# Patient Record
Sex: Female | Born: 1985 | Race: Black or African American | Hispanic: No | Marital: Single | Smoking: Current some day smoker
Health system: Southern US, Community
[De-identification: ages and names within clinical notes are randomized; demographics above are authoritative.]

## PROBLEM LIST (undated history)

## (undated) DIAGNOSIS — F32A Depression, unspecified: Secondary | ICD-10-CM

## (undated) DIAGNOSIS — F419 Anxiety disorder, unspecified: Secondary | ICD-10-CM

## (undated) DIAGNOSIS — R87612 Low grade squamous intraepithelial lesion on cytologic smear of cervix (LGSIL): Secondary | ICD-10-CM

## (undated) HISTORY — DX: Low grade squamous intraepithelial lesion on cytologic smear of cervix (LGSIL): R87.612

## (undated) HISTORY — DX: Anxiety disorder, unspecified: F41.9

## (undated) HISTORY — DX: Depression, unspecified: F32.A

---

## 2005-06-16 ENCOUNTER — Emergency Department: Payer: Self-pay | Admitting: Unknown Physician Specialty

## 2005-08-28 ENCOUNTER — Emergency Department: Payer: Self-pay | Admitting: Emergency Medicine

## 2007-06-01 ENCOUNTER — Emergency Department: Payer: Self-pay | Admitting: Emergency Medicine

## 2007-06-01 ENCOUNTER — Other Ambulatory Visit: Payer: Self-pay

## 2008-03-15 HISTORY — PX: COLPOSCOPY: SHX161

## 2008-10-03 ENCOUNTER — Emergency Department: Payer: Self-pay | Admitting: Emergency Medicine

## 2008-11-09 ENCOUNTER — Emergency Department: Payer: Self-pay | Admitting: Unknown Physician Specialty

## 2008-11-09 ENCOUNTER — Emergency Department: Payer: Self-pay | Admitting: Emergency Medicine

## 2009-05-17 ENCOUNTER — Emergency Department: Payer: Self-pay | Admitting: Emergency Medicine

## 2009-06-17 ENCOUNTER — Ambulatory Visit: Payer: Self-pay

## 2009-07-10 ENCOUNTER — Emergency Department: Payer: Self-pay | Admitting: Emergency Medicine

## 2009-10-02 ENCOUNTER — Emergency Department: Payer: Self-pay | Admitting: Emergency Medicine

## 2013-07-11 ENCOUNTER — Other Ambulatory Visit (HOSPITAL_COMMUNITY): Payer: Self-pay

## 2013-07-17 ENCOUNTER — Ambulatory Visit (HOSPITAL_COMMUNITY): Payer: Self-pay

## 2013-07-19 ENCOUNTER — Encounter: Payer: Self-pay | Admitting: Medical

## 2013-08-29 ENCOUNTER — Encounter (HOSPITAL_COMMUNITY): Payer: Self-pay | Admitting: Emergency Medicine

## 2013-08-29 ENCOUNTER — Emergency Department (HOSPITAL_COMMUNITY): Payer: Self-pay

## 2013-08-29 ENCOUNTER — Emergency Department (HOSPITAL_COMMUNITY)
Admission: EM | Admit: 2013-08-29 | Discharge: 2013-08-29 | Disposition: A | Discharge status: Home / Self Care | Payer: Self-pay | Attending: Emergency Medicine | Admitting: Emergency Medicine

## 2013-08-29 DIAGNOSIS — M25561 Pain in right knee: Secondary | ICD-10-CM

## 2013-08-29 DIAGNOSIS — Y9341 Activity, dancing: Secondary | ICD-10-CM | POA: Insufficient documentation

## 2013-08-29 DIAGNOSIS — Y929 Unspecified place or not applicable: Secondary | ICD-10-CM | POA: Insufficient documentation

## 2013-08-29 DIAGNOSIS — S8390XA Sprain of unspecified site of unspecified knee, initial encounter: Secondary | ICD-10-CM

## 2013-08-29 DIAGNOSIS — X500XXA Overexertion from strenuous movement or load, initial encounter: Secondary | ICD-10-CM | POA: Insufficient documentation

## 2013-08-29 DIAGNOSIS — S838X9A Sprain of other specified parts of unspecified knee, initial encounter: Secondary | ICD-10-CM | POA: Insufficient documentation

## 2013-08-29 DIAGNOSIS — S86819A Strain of other muscle(s) and tendon(s) at lower leg level, unspecified leg, initial encounter: Principal | ICD-10-CM

## 2013-08-29 MED ORDER — IBUPROFEN 600 MG PO TABS: 600.0000 mg | ORAL_TABLET | Freq: Four times a day (QID) | ORAL | Status: DC | PRN

## 2013-08-29 NOTE — ED Notes (Signed)
C/O right knee pain since 2014. Has had off and on pain in right knee. Pt is applying for a job and wants to be able to tell her potential employer that her knee will not be a problem. No swelling or deformity. States initially injured her knee while dancing.

## 2013-08-29 NOTE — ED Provider Notes (Signed)
CSN: [id number]     Arrival date & time 08/29/13  1152 History  This chart was scribed for non-physician practitioner, Junius FinnerErin O'Malley, PA-C working with Shanna CiscoMegan E Docherty, MD by Greggory StallionKayla Andersen, ED scribe. This patient was seen in room TR05C/TR05C and the patient's care was started at 12:28 PM.   Chief Complaint  Patient presents with  . Knee Pain   The history is provided by the patient. No language interpreter was used.   HPI Comments: Susan Mcknight is a 28 y.o. female who presents to the Emergency Department complaining of right knee injury that occurred one week ago. Pt states her knee popped out of place while dancing. She has sudden onset aching pain with associated mild swelling. Bearing weight worsens the pain, 5/10 at worst. Pt has taken ibuprofen and used an ACE wrap with little relief. Denies numbness or tingling. Denies prior knee surgeries.   History reviewed. No pertinent past medical history. History reviewed. No pertinent past surgical history. No family history on file. History  Substance Use Topics  . Smoking status: Never Smoker   . Smokeless tobacco: Not on file  . Alcohol Use: Yes   OB History   Grav Para Term Preterm Abortions TAB SAB Ect Mult Living                 Review of Systems  Musculoskeletal: Positive for arthralgias and joint swelling.  Neurological: Negative for numbness.  All other systems reviewed and are negative.  Allergies  Review of patient's allergies indicates no known allergies.  Home Medications   Prior to Admission medications   Medication Sig Start Date End Date Taking? Authorizing Provider  ibuprofen (ADVIL,MOTRIN) 600 MG tablet Take 1 tablet (600 mg total) by mouth every 6 (six) hours as needed. 08/29/13   Junius FinnerErin O'Malley, PA-C   BP 113/65  Pulse 77  Temp(Src) 98.7 F (37.1 C) (Oral)  Resp 14  SpO2 100%  LMP 08/13/2013  Physical Exam  Nursing note and vitals reviewed. Constitutional: She is oriented to person, place, and time.  She appears well-developed and well-nourished.  HENT:  Head: Normocephalic and atraumatic.  Eyes: EOM are normal.  Neck: Normal range of motion.  Cardiovascular: Normal rate.   Pedal pulse 2+.   Pulmonary/Chest: Effort normal.  Musculoskeletal: Normal range of motion.  Mild edema to medial aspect of right knee with tenderness along the medial joint space. Full ROM. Strength 5/5 in lower extremities bilaterally.   Neurological: She is alert and oriented to person, place, and time.  Sensation intact.   Skin: Skin is warm and dry. No rash noted. No erythema.  Skin in tact. No ecchymosis, erythema, or warmth. No red streaking, induration, or evidence of underlying infection  Psychiatric: She has a normal mood and affect. Her behavior is normal.    ED Course  Procedures (including critical care time)  DIAGNOSTIC STUDIES: Oxygen Saturation is 100% on RA, normal by my interpretation.    COORDINATION OF CARE: 12:30 PM-Discussed treatment plan which includes xray with pt at bedside and pt agreed to plan.   Labs Review Labs Reviewed - No data to display  Imaging Review Dg Knee Complete 4 Views Right  08/29/2013   CLINICAL DATA:  Pain.  EXAM: RIGHT KNEE - COMPLETE 4+ VIEW  COMPARISON:  None.  FINDINGS: There is no evidence of fracture, dislocation, or joint effusion. There is no evidence of arthropathy or other focal bone abnormality. Soft tissues are unremarkable.  IMPRESSION: Negative.   Electronically Signed  ByMaisie Fus: Thomas  Register   On: 08/29/2013 12:46     EKG Interpretation None      MDM   Final diagnoses:  Right knee pain  Knee sprain    Pt is a 27yo female c/o right knee pain after injuring it while dancing last week. FROM of right knee but mild edema and tenderness along medical joint space line. Neurovascularly in tact. Plain films: negative for acute injury.  Will tx pain symptomatically with knee sleeve and crutches.  Discussed RICE tx at home. Advised to f/u with Dr.  Ranell PatrickNorris, orthopedics if pain not improving. Pt verbalized understanding and agreement with tx plan.  I personally performed the services described in this documentation, which was scribed in my presence. The recorded information has been reviewed and is accurate.  Junius FinnerErin O'Malley, PA-C 08/30/13 [phone]

## 2013-08-31 NOTE — ED Provider Notes (Signed)
Medical screening examination/treatment/procedure(s) were performed by non-physician practitioner and as supervising physician I was immediately available for consultation/collaboration.   Megan E Docherty, MD 08/31/13 0018 

## 2013-10-31 ENCOUNTER — Emergency Department (HOSPITAL_COMMUNITY): Payer: Self-pay

## 2013-10-31 ENCOUNTER — Emergency Department (HOSPITAL_COMMUNITY)
Admission: EM | Admit: 2013-10-31 | Discharge: 2013-10-31 | Disposition: A | Discharge status: Home / Self Care | Payer: Self-pay | Attending: Emergency Medicine | Admitting: Emergency Medicine

## 2013-10-31 ENCOUNTER — Encounter (HOSPITAL_COMMUNITY): Payer: Self-pay | Admitting: Emergency Medicine

## 2013-10-31 DIAGNOSIS — IMO0002 Reserved for concepts with insufficient information to code with codable children: Secondary | ICD-10-CM | POA: Insufficient documentation

## 2013-10-31 DIAGNOSIS — Y9301 Activity, walking, marching and hiking: Secondary | ICD-10-CM | POA: Insufficient documentation

## 2013-10-31 DIAGNOSIS — X500XXA Overexertion from strenuous movement or load, initial encounter: Secondary | ICD-10-CM | POA: Insufficient documentation

## 2013-10-31 DIAGNOSIS — S99919A Unspecified injury of unspecified ankle, initial encounter: Secondary | ICD-10-CM

## 2013-10-31 DIAGNOSIS — S8391XA Sprain of unspecified site of right knee, initial encounter: Secondary | ICD-10-CM

## 2013-10-31 DIAGNOSIS — Y9289 Other specified places as the place of occurrence of the external cause: Secondary | ICD-10-CM | POA: Insufficient documentation

## 2013-10-31 DIAGNOSIS — S99929A Unspecified injury of unspecified foot, initial encounter: Secondary | ICD-10-CM

## 2013-10-31 DIAGNOSIS — S8990XA Unspecified injury of unspecified lower leg, initial encounter: Secondary | ICD-10-CM | POA: Insufficient documentation

## 2013-10-31 MED ORDER — IBUPROFEN 400 MG PO TABS
800.0000 mg | ORAL_TABLET | Freq: Once | ORAL | Status: AC
  Administered 2013-10-31: 800 mg via ORAL
  Filled 2013-10-31: qty 2

## 2013-10-31 NOTE — ED Provider Notes (Signed)
CSN: [id number]     Arrival date & time 10/31/13  1349 History  This chart was scribed for Susan EmeryNicole Dushawn Pusey, NP, working with Susan RudeNathan R. Rubin PayorPickering, MD by Chestine SporeSoijett Mcknight, ED Scribe. The patient was seen in room TR05C/TR05C at 3:18 PM.    Chief Complaint  Patient presents with  . Knee Pain      The history is provided by the patient. No language interpreter was used.   HPI Comments: Susan Mcknight is a 28 y.o. female who presents to the Emergency Department complaining of right knee pain onset 2 days. She states that she twisted her knee while hiking. She rates her pain as 9/10. She states that she thinks she stepped wrong. She states that she has been limping and not putting much weight on it. She states that she has tried OTC aspirin with no relief for her symptoms. She denies any other associated symptoms. She states that she did not take any pain medications.   History reviewed. No pertinent past medical history. History reviewed. No pertinent past surgical history. No family history on file. History  Substance Use Topics  . Smoking status: Never Smoker   . Smokeless tobacco: Not on file  . Alcohol Use: No   OB History   Grav Para Term Preterm Abortions TAB SAB Ect Mult Living                 Review of Systems  A complete 10 system review of systems was obtained and all systems are negative except as noted in the HPI and PMH.    Allergies  Review of patient's allergies indicates no known allergies.  Home Medications   Prior to Admission medications   Not on File   BP 115/63  Pulse 81  Temp(Src) 98.3 F (36.8 C) (Oral)  Resp 18  SpO2 99%  LMP 10/08/2013  Physical Exam  Nursing note and vitals reviewed. Constitutional: She is oriented to person, place, and time. She appears well-developed and well-nourished. No distress.  HENT:  Head: Normocephalic.  Eyes: Conjunctivae and EOM are normal.  Cardiovascular: Normal rate.   Pulmonary/Chest: Effort normal. No stridor.   Musculoskeletal: Normal range of motion.  Right knee:  No deformity, erythema or abrasions. FROM. No effusion or crepitance. Anterior and posterior drawer show no abnormal laxity. Stable to valgus and varus stress. Joint lines are non-tender. Neurovascularly intact. Pt ambulates with non-antalgic gait.    Neurological: She is alert and oriented to person, place, and time.  Psychiatric: She has a normal mood and affect.    ED Course  Procedures (including critical care time) DIAGNOSTIC STUDIES: Oxygen Saturation is 99% on room air, normal by my interpretation.    COORDINATION OF CARE: 3:21 PM-Discussed treatment plan which includes Crutches, Ace Wrap, and Motrin with pt at bedside and pt agreed to plan.   Labs Review Labs Reviewed - No data to display  Imaging Review Dg Knee Complete 4 Views Right  10/31/2013   CLINICAL DATA:  Right knee twisting injury with pain.  EXAM: RIGHT KNEE - COMPLETE 4+ VIEW  COMPARISON:  None.  FINDINGS: There may be a tiny joint effusion. No fracture. No degenerative changes.  IMPRESSION: Question a tiny joint effusion.  No fracture.   Electronically Signed   By: Leanna BattlesMelinda  Blietz M.D.   On: 10/31/2013 14:25     EKG Interpretation None      MDM   Final diagnoses:  Right knee sprain, initial encounter    Filed Vitals:  10/31/13 1400  BP: 115/63  Pulse: 81  Temp: 98.3 F (36.8 C)  TempSrc: Oral  Resp: 18  SpO2: 99%    Medications  ibuprofen (ADVIL,MOTRIN) tablet 800 mg (800 mg Oral Given 10/31/13 1541)    Susan Mcknight is a 28 y.o. female presenting with right knee pain after patient may have twisted it while hiking several days ago. Physical exam and x-ray is unremarkable. Patient will be given crutches, Ace wrap and advised RICE.   Evaluation does not show pathology that would require ongoing emergent intervention or inpatient treatment. Pt is hemodynamically stable and mentating appropriately. Discussed findings and plan with  patient/guardian, who agrees with care plan. All questions answered. Return precautions discussed and outpatient follow up given.    I personally performed the services described in this documentation, which was scribed in my presence. The recorded information has been reviewed and is accurate.    Susan Emery, PA-C 10/31/13 1652

## 2013-10-31 NOTE — ED Notes (Signed)
PT refused crutches. PA aware. OK for discharge

## 2013-10-31 NOTE — Discharge Instructions (Signed)
For pain control you may take up to 800mg  of Motrin (also known as ibuprofen). That is usually 4 over the counter pills,  3 times a day. Take with food to minimize stomach irritation   You can also take  tylenol (acetaminophen) 975mg  (this is 3 over the counter pills) four times a day. Do not drink alcohol or combine with other medications that have acetaminophen as an ingredient (Read the labels!).   Do not hesitate to return to the emergency room for any new, worsening or concerning symptoms.  Please obtain primary care using resource guide below. But the minute you were seen in the emergency room and that they will need to obtain records for further outpatient management.   Emergency Department Resource Guide 1) Find a Doctor and Pay Out of Pocket Although you won't have to find out who is covered by your insurance plan, it is a good idea to ask around and get recommendations. You will then need to call the office and see if the doctor you have chosen will accept you as a new patient and what types of options they offer for patients who are self-pay. Some doctors offer discounts or will set up payment plans for their patients who do not have insurance, but you will need to ask so you aren't surprised when you get to your appointment.  2) Contact Your Local Health Department Not all health departments have doctors that can see patients for sick visits, but many do, so it is worth a call to see if yours does. If you don't know where your local health department is, you can check in your phone book. The CDC also has a tool to help you locate your state's health department, and many state websites also have listings of all of their local health departments.  3) Find a Walk-in Clinic If your illness is not likely to be very severe or complicated, you may want to try a walk in clinic. These are popping up all over the country in pharmacies, drugstores, and shopping centers. They're usually staffed by  nurse practitioners or physician assistants that have been trained to treat common illnesses and complaints. They're usually fairly quick and inexpensive. However, if you have serious medical issues or chronic medical problems, these are probably not your best option.  No Primary Care Doctor: - Call Health Connect at  [phone] - they can help you locate a primary care doctor that  accepts your insurance, provides certain services, etc. - Physician Referral Service- [phone]  Chronic Pain Problems: Organization         Address  Phone   Notes  [facility]  [phone] Patients need to be referred by their primary care doctor.   Medication Assistance: Organization         Address  Phone   Notes  [facility] [address] [place], [place] [id number] [phone] --Must be a resident of [place] -- Must have NO insurance coverage whatsoever (no Medicaid/ Medicare, etc.) -- The pt. MUST have a primary care doctor that directs their care regularly and follows them in the community   MedAssist  [phone]   [employer]  [phone]    Agencies that provide inexpensive medical care: Organization         Address  Phone   Notes  [facility]  [phone]   [facility]    [phone]  [facility] [place], [place] [address] [phone]   [place]. [address] [place] [phone]   Planned Parenthood    [phone]   [facility]    [phone]   [place] and [employer] [address], [place] Phone:  [phone], Fax:  [phone] Hours of Operation:  9 am - 6 pm, M-F.  Also accepts Medicaid/Medicare and self-pay.  [facility] for [address], [address], [place] Phone: [phone], Fax: [phone]. Hours of Operation:  8:30  am - 5:30 pm, M-F.  Also accepts Medicaid and self-pay.  [facility] [address] [place] Phone: [phone]   [place], [place], [place] [phone], Ext. [phone] Mondays & Thursdays: 7-9 AM.  First 15 patients are seen on a first come, first serve basis.    [place] Providers:  Organization         Address  Phone   Notes  [facility] [address], [place] [phone] Also accepts self-pay patients.  [facility] [id number] [place], [place]  [phone]   [place], [address], [place] [phone]   [facility] [address], [place] [phone]   Lucianne Lei [address], [place]   [phone] Only accepts [place] Access [place] patients after they have their name applied to their card.   Self-Pay (no insurance) in [facility]:  Organization         Address  Phone   Notes  Sickle Cell Patients, [facility] Internal Medicine [place] [phone]   [facility] Urgent Care [place] [phone]   [facility] [place]  [place], [place], [place] [phone]   [facility]/Dr. Osei-Bonsu  [address], [place] or [address], [place] [phone] Phone number for both [place] and [place] locations is the same.  Urgent Medical and [facility] [address], [place] [phone]   [facility] [address], [place] or [address] [phone]   [facility] [address] [place] [phone], phone; [phone], fax Sees patients 1st and 3rd Saturday of every month.  Must not qualify for public or private insurance (i.e. Medicaid, Medicare, [place] Health  Choice, Veterans' Benefits)  Household income should be no more than 200% of the poverty level The clinic cannot treat you if you are pregnant or think you are pregnant  Sexually transmitted diseases are not treated at the clinic.    Dental Care: Organization         Address  Phone  Notes  [facility] Department of [facility] [phone] Accepts children up to age 15 who are enrolled in [place] or [place]; pregnant women with a Medicaid card; and children who have applied for Medicaid or [place] Health Choice, but were declined, whose parents can pay a reduced fee at time of service.  [facility] Department of [facility]  [address], [place] [phone] Accepts children up to age 54 who  are enrolled in Medicaid or [place] Health Choice; pregnant women with a Medicaid card; and children who have applied for Medicaid or [place] Health Choice, but were declined, whose parents can pay a reduced fee at time of service.  [place] Adult Dental Access PROGRAM  [place] [phone] Patients are seen by appointment only. Walk-ins are not accepted. [place] will see patients 27 years of age and older. Monday - Tuesday (8am-5pm) Most Wednesdays (8:30-5pm) $30 per visit, cash only  [facility] Adult [facility] Access PROGRAM  [address], [facility] [phone] Patients are seen by appointment only. Walk-ins are not accepted. [place] will see patients 61 years of age and older. One Wednesday Evening (Monthly: Volunteer Based).  $30 per visit, cash only  [place]  [phone] for adults; Children under age 84, call Graduate Pediatric Dentistry at [phone]. Children aged 39-14, please call [phone] to request a pediatric application.  Dental services are provided in all areas of dental care including fillings, crowns and bridges, complete  and partial dentures, implants, gum treatment, root canals, and extractions. Preventive care is also provided. Treatment is provided to both adults and children. Patients are selected via a lottery and there is often a waiting list.   [facility] [address], [place]  [phone] [email or web address]   [facility] [address] [place], [place] [phone], Ext. [phone] Second and Fourth Thursday of each month, opens at 6:30 AM; Clinic ends at 9 AM.  Patients are seen on a first-come first-served basis, and a limited number are seen during each clinic.   [facility]  [address] [place], [place] [phone]   Eligibility Requirements You must have lived in [place], [place], or [place] counties for at least the last three months.   You cannot be eligible for state or federal sponsored [employer], including [employer], [place], or [employer].   You generally cannot be eligible for healthcare insurance through your employer.    How to apply: Eligibility screenings are held every Tuesday and Wednesday afternoon from 1:00 pm until 4:00 pm. You do not need an appointment for the interview!  [facility] [address], [place], [place]   [place]  [place] Department  [place]  [phone]    Behavioral Health Resources in the Community: Intensive Outpatient Programs Organization         Address  Phone  Notes  [place]. [address] [place], [place] [phone]   [facility] [address] [place], [place]   ADS: Alcohol & Drug Svcs [address], [place], [place]   [place] [address]  [place], [place] or [phone]   Substance Abuse Resources Organization          Address  Phone  Notes  Alcohol and Drug Services  [phone]   [place]  [phone]   The [place]   Chinita Pester  [phone]   Residential & Outpatient Substance Abuse Program  [phone]   Psychological Services Organization         Address  Phone  Notes  [facility]  [place]  [phone]   [place] [address],  [place] [phone] or [phone]    Mobile Crisis Teams Organization         Address  Phone  Notes  [facility]atives, Mobile Crisis Care Unit  [phone]   Assertive Psychotherapeutic Services  [address] [place], [place]   [place] [address], [place] [phone]    Self-Help/Support Groups Organization         Address  Phone             Notes  [place]. of [place] - variety of support groups  [place] Call for more information  Narcotics Anonymous (NA), Caring [facility] [address], [employer] [place]  2 meetings at this location   Special educational needs teacher         Address  Phone  Notes  ASAP Residential Treatment [place],    [employer]  [phone]   [facility]  [address], [place] [address]83, [place], [place]   Ricardo [place], [place] [phone] Admissions: 8am-3pm M-F  Incentives Substance [place] [address],    [place], [place] [phone]   [facility] [address] [place], [place], [place]   [facility] [address],  [place], [place] Choice   Insight Programs - Intensive Outpatient [address], [place], [place]   [facility] ([place] [address].,  [place], [place] [phone] or [phone]   [facility]idential Treatment Services (RTS) [address] [place], [place] Accepts Medicaid  [facility] Rodri­guez [place] [address]  [place] [phone] Substance Abuse/Addiction Treatment   [facility] Organization         Address  Phone  Notes  [facility]  [phone]   Domenic Schwab, PhD [address] Arlis [place], [place]   [phone] or [phone]   [facility], [place] [phone]   [facility] [address], [place], [place] [phone] Insurance/Medicaid/sponsorship through [facility] and Families [address]                                    [place], [place] [phone] [place] [address], [place] [phone]    Dr. Adele Schilder  [phone]   [facility] Dept. 1) [address] [place] 2) [place] 3)  [place] [address], [place] [phone]  [phone]   [place] [phone] or [phone] (After Hours)

## 2013-10-31 NOTE — ED Notes (Signed)
Pt presents to department for evaluation of R knee pain, ongoing x2 days. Pt states she injured knee while hiking, no obvious deformities noted. Pt is alert and oriented x4. 9/10 pain upon arrival.

## 2013-10-31 NOTE — Progress Notes (Signed)
Orthopedic Tech Progress Note Patient Details:  Susan LauthLatia Mcknight 10/13/1985 [id number] Pt has declined the crutches;rn notified Ortho Devices Type of Ortho Device: Ace wrap Ortho Device/Splint Location: rle Ortho Device/Splint Interventions: Application   Vincen Bejar 10/31/2013, 3:30 PM

## 2013-11-01 NOTE — ED Provider Notes (Signed)
Medical screening examination/treatment/procedure(s) were performed by non-physician practitioner and as supervising physician I was immediately available for consultation/collaboration.   EKG Interpretation None       Leila Schuff R. Danice Dippolito, MD 11/01/13 0956 

## 2013-12-13 ENCOUNTER — Encounter (HOSPITAL_COMMUNITY): Payer: Self-pay | Admitting: *Deleted

## 2014-01-22 ENCOUNTER — Encounter (HOSPITAL_COMMUNITY): Payer: Self-pay | Admitting: *Deleted

## 2014-01-22 ENCOUNTER — Emergency Department (HOSPITAL_COMMUNITY)
Admission: EM | Admit: 2014-01-22 | Discharge: 2014-01-22 | Disposition: A | Discharge status: Home / Self Care | Payer: Self-pay | Attending: Emergency Medicine | Admitting: Emergency Medicine

## 2014-01-22 DIAGNOSIS — J40 Bronchitis, not specified as acute or chronic: Secondary | ICD-10-CM

## 2014-01-22 DIAGNOSIS — J209 Acute bronchitis, unspecified: Secondary | ICD-10-CM | POA: Insufficient documentation

## 2014-01-22 MED ORDER — AZITHROMYCIN 250 MG PO TABS: 250.0000 mg | ORAL_TABLET | Freq: Every day | ORAL | Status: DC

## 2014-01-22 MED ORDER — DEXTROMETHORPHAN POLISTIREX 30 MG/5ML PO LQCR
30.0000 mg | ORAL | Status: DC | PRN
Start: 2014-01-22 — End: 2014-02-27

## 2014-01-22 NOTE — ED Provider Notes (Signed)
CSN: [id number]     Arrival date & time 01/22/14  1215 History  This chart was scribed for non-physician practitioner, Emilia BeckKaitlyn Laken Rog, PA-C working with Ward GivensIva L Knapp, MD by Greggory StallionKayla Andersen, ED scribe. This patient was seen in room TR08C/TR08C and the patient's care was started at 1:09 PM.    Chief Complaint  Patient presents with  . Nasal Congestion   The history is provided by the patient. No language interpreter was used.   HPI Comments: Susan Mcknight is a 28 y.o. female who presents to the Emergency Department complaining of nasal congestion and productive cough of yellow/green sputum that started one week ago. States it worsened 3 days ago. She has taken tylenol cold and flu and robitussin DM with little relief. Denies fever.   History reviewed. No pertinent past medical history. History reviewed. No pertinent past surgical history. History reviewed. No pertinent family history. History  Substance Use Topics  . Smoking status: Never Smoker   . Smokeless tobacco: Not on file  . Alcohol Use: No   OB History    No data available     Review of Systems  Constitutional: Negative for fever.  HENT: Positive for congestion.   Respiratory: Positive for cough.   All other systems reviewed and are negative.  Allergies  Review of patient's allergies indicates no known allergies.  Home Medications   Prior to Admission medications   Not on File   BP 116/73 mmHg  Pulse 86  Temp(Src) 98.3 F (36.8 C) (Oral)  Resp 14  SpO2 97%  LMP 01/04/2014   Physical Exam  Constitutional: She is oriented to person, place, and time. She appears well-developed and well-nourished. No distress.  HENT:  Head: Normocephalic and atraumatic.  Mouth/Throat: Oropharynx is clear and moist. No oropharyngeal exudate, posterior oropharyngeal edema or posterior oropharyngeal erythema.  Eyes: Conjunctivae and EOM are normal.  Neck: Neck supple. No tracheal deviation present.  Cardiovascular: Normal rate,  regular rhythm and normal heart sounds.   Pulmonary/Chest: Effort normal and breath sounds normal. No respiratory distress. She has no wheezes. She has no rhonchi. She has no rales.  Musculoskeletal: Normal range of motion.  Neurological: She is alert and oriented to person, place, and time.  Skin: Skin is warm and dry.  Psychiatric: She has a normal mood and affect. Her behavior is normal.  Nursing note and vitals reviewed.   ED Course  Procedures (including critical care time)  DIAGNOSTIC STUDIES: Oxygen Saturation is 97% on RA, normal by my interpretation.    COORDINATION OF CARE: 1:11 PM-Discussed treatment plan which includes cough syrup and z-pack with pt at bedside and pt agreed to plan.   Labs Review Labs Reviewed - No data to display  Imaging Review No results found.   EKG Interpretation None      MDM   Final diagnoses:  Bronchitis    Patient's chest exam unremarkable for acute changes. Vitals stable and patient afebrile. Patient will have delsym and azithromycin for bronchitis. Patient instructed to return with worsening or concerning symptoms.   I personally performed the services described in this documentation, which was scribed in my presence. The recorded information has been reviewed and is accurate.  Emilia BeckKaitlyn Fred Hammes, PA-C 01/22/14 [id number]  Claude Swendsen, PA-C 01/26/14 2214  Ward GivensIva L Knapp, MD 01/28/14 1312

## 2014-01-22 NOTE — Discharge Instructions (Signed)
Take azithromycin as directed until gone. Take delsym as needed for cough. Refer to attached documents for more information.

## 2014-01-22 NOTE — ED Notes (Addendum)
Congested x 2 weeks - worse x 3 days.  [place]. Prescribed robitussin dm mix, which resolved her congestion. When coughing so bad she brings up some emesis but n/v. Yellow/green productive cough.

## 2014-01-23 ENCOUNTER — Encounter (HOSPITAL_COMMUNITY): Payer: Self-pay | Admitting: Emergency Medicine

## 2014-01-24 ENCOUNTER — Encounter (HOSPITAL_COMMUNITY): Payer: Self-pay

## 2014-01-24 ENCOUNTER — Ambulatory Visit (HOSPITAL_COMMUNITY)
Admission: RE | Admit: 2014-01-24 | Discharge: 2014-01-24 | Disposition: A | Discharge status: Home / Self Care | Payer: Self-pay | Source: Ambulatory Visit | Attending: Obstetrics and Gynecology | Admitting: Obstetrics and Gynecology

## 2014-01-24 VITALS — BP 118/76 | Ht 62.0 in | Wt 144.6 lb

## 2014-01-24 DIAGNOSIS — Z1239 Encounter for other screening for malignant neoplasm of breast: Secondary | ICD-10-CM

## 2014-01-24 NOTE — Addendum Note (Signed)
Encounter addended by: Priscille Heidelberghristine P Brannock, RN on: 01/24/2014  2:49 PM<BR>     Documentation filed: Notes Section

## 2014-01-24 NOTE — Patient Instructions (Signed)
Explained to Susan LauthLatia Mcknight that she did not need a Pap smear today due to last Pap smear was 11/29/2013. [facility] to follow-up for abnormal Pap smear. Appointment scheduled for Thursday, January 31, 2014 at 1240. Patient aware of appointment and will be there. Let patient know a screening mammogram is recommended at age 28 unless clinically indicated prior. Susan LauthLatia Saladin verbalized understanding.  Skylar Flynt, Kathaleen Maserhristine Poll, RN 12:08 PM

## 2014-01-24 NOTE — Progress Notes (Addendum)
[facility] to follow-up for abnormal Pap smear 11/29/2013.  Pap Smear:    Pap smear not completed today. [facility] and ASCUS HPV +. Patient has a history of two previous abnormal Pap smears 04/10/2013 that was ASCUS HPV+ and in 2010. Patient has a history of a colposcopy completed in 2010 abnormal Pap smear. Patient stated that all Pap smears have been normal since colposcopy.  [facility] for a colposcopy to follow-up for abnormal Pap smear. Appointment scheduled for Thursday, January 31, 2014 at 1240. Pap smear result is scanned into EPIC under media.  Physical exam: Breasts Breasts symmetrical. No skin abnormalities bilateral breasts. No nipple retraction bilateral breasts. No nipple discharge bilateral breasts. No lymphadenopathy. No lumps palpated bilateral breasts. No complaints of pain or tenderness on exam. Screening mammogram recommended at age 28 unless clinically recommended prior.     Pelvic/Bimanual No Pap smear completed today since last Pap smear was 11/29/2013. Pap smear not indicated per [facility] guidelines.

## 2014-01-31 ENCOUNTER — Other Ambulatory Visit (HOSPITAL_COMMUNITY)
Admission: RE | Admit: 2014-01-31 | Discharge: 2014-01-31 | Disposition: A | Discharge status: Home / Self Care | Payer: Self-pay | Source: Ambulatory Visit | Attending: Family Medicine | Admitting: Family Medicine

## 2014-01-31 ENCOUNTER — Ambulatory Visit (INDEPENDENT_AMBULATORY_CARE_PROVIDER_SITE_OTHER): Payer: Self-pay | Admitting: Family Medicine

## 2014-01-31 ENCOUNTER — Encounter: Payer: Self-pay | Admitting: Family Medicine

## 2014-01-31 VITALS — BP 124/71 | HR 86 | Ht 62.0 in | Wt 143.3 lb

## 2014-01-31 DIAGNOSIS — IMO0002 Reserved for concepts with insufficient information to code with codable children: Secondary | ICD-10-CM

## 2014-01-31 DIAGNOSIS — R87610 Atypical squamous cells of undetermined significance on cytologic smear of cervix (ASC-US): Secondary | ICD-10-CM | POA: Insufficient documentation

## 2014-01-31 DIAGNOSIS — R87810 Cervical high risk human papillomavirus (HPV) DNA test positive: Secondary | ICD-10-CM | POA: Insufficient documentation

## 2014-01-31 DIAGNOSIS — N9489 Other specified conditions associated with female genital organs and menstrual cycle: Secondary | ICD-10-CM

## 2014-01-31 LAB — POCT PREGNANCY, URINE: Preg Test, Ur: NEGATIVE

## 2014-01-31 MED ORDER — IBUPROFEN 200 MG PO TABS: 800.0000 mg | ORAL_TABLET | Freq: Once | ORAL | Status: DC

## 2014-01-31 NOTE — Addendum Note (Signed)
Addended by: Gerome ApleyZEYFANG, Remi Rester L on: 01/31/2014 03:45 PM   Modules accepted: Orders

## 2014-01-31 NOTE — Procedures (Signed)
Patient ID: Susan LauthLatia Piano, female   DOB: 02/24/1986, 28 y.o.   MRN: [id number]  Chief Complaint  Patient presents with  . Colposcopy    HPI Susan Mcknight is a 28 y.o. female here for colposcopy. HPI  Indications: Pap smear in January and again in September 2015 showed: ASCUS with POSITIVE high risk HPV. Previous colposcopy: in 2012 in  reportedly noted "HPV traits" and no biopsies. Prior cervical treatment: no treatment.  No past medical history on file.  Past Surgical History  Procedure Laterality Date  . Colposcopy  2010    No family history on file.  Social History History  Substance Use Topics  . Smoking status: Never Smoker   . Smokeless tobacco: Never Used  . Alcohol Use: No    No Known Allergies  Current Outpatient Prescriptions  Medication Sig Dispense Refill  . azithromycin (ZITHROMAX Z-PAK) 250 MG tablet Take 1 tablet (250 mg total) by mouth daily. 500mg  PO day 1, then 250mg  PO days 205 6 tablet 0  . dextromethorphan (DELSYM) 30 MG/5ML liquid Take 5 mLs (30 mg total) by mouth as needed for cough. 89 mL 0  . ibuprofen (ADVIL,MOTRIN) 600 MG tablet Take 1 tablet (600 mg total) by mouth every 6 (six) hours as needed. 30 tablet 0  . Norgestimate-Ethinyl Estradiol Triphasic (ORTHO TRI-CYCLEN LO) 0.18/0.215/0.25 MG-25 MCG tab Take 1 tablet by mouth daily.     Current Facility-Administered Medications  Medication Dose Route Frequency Provider Last Rate Last Dose  . ibuprofen (ADVIL,MOTRIN) tablet 800 mg  800 mg Oral Once Perry MountKristy Rocio Tiona Ruane, MD        Review of Systems Review of Systems See HPI Blood pressure 124/71, pulse 86, height 5\' 2"  (1.575 m), weight 143 lb 4.8 oz (65 kg), last menstrual period 01/28/2014.  Physical Exam Physical Exam  Constitutional: She appears well-developed and well-nourished. No distress.  HENT:  Mouth/Throat: Mucous membranes are moist. Pharynx is normal.  Eyes: Conjunctivae and EOM are normal.  Neck: No adenopathy.    Cardiovascular: Normal rate and S2 normal.   Pulmonary/Chest: Effort normal.  Abdominal: She exhibits no distension. There is no tenderness.  Genitourinary:    Neurological: She is alert.  Skin: Skin is warm. She is not diaphoretic.    Data Reviewed Previous pap  Assessment    Procedure Details  The risks and benefits of the procedure and Written informed consent obtained.  Speculum placed in vagina and excellent visualization of cervix achieved, cervix swabbed x 3 with acetic acid solution.  Specimens: 2 biopsies at 11 o'clock transformation zone.  ECC also done  Complications: none.  Hemostatic biopsy     Plan    Specimens labelled and sent to Pathology. Will call patient in 2 weeks with results of pathology  Medicated with 800mg  ibuprofen post-biopsy      Jaicee Michelotti ROCIO 01/31/2014, 1:36 PM

## 2014-02-20 ENCOUNTER — Telehealth: Payer: Self-pay

## 2014-02-20 NOTE — Telephone Encounter (Signed)
-----   Message from Perry MountKristy Rocio Acosta, MD sent at 02/19/2014  7:16 PM EST ----- Same deal, yes repeat cotesting 1 year.  Wilkie AyeKristy ----- Message -----    From: Louanna Rawaylor M Derian Pfost, RN    Sent: 02/19/2014   8:34 AM      To: Perry MountKristy Rocio Acosta, MD  Hi Dr. Loreta AveAcosta,   In reviewing results, this patient's colpo LSIL-- should we inform the patient to repeat pap/co testing in 1 year? Please advise.   Thank you,   Ladona Ridgelaylor, RN

## 2014-02-20 NOTE — Telephone Encounter (Signed)
Called patient and informed her of results and recommendations. Informed patient she can have repeat pap smear in [facility] year at [facility] she [facility] seen. Patient verbalized understanding and gratitude. No questions or concerns.

## 2014-02-27 ENCOUNTER — Encounter (HOSPITAL_COMMUNITY): Payer: Self-pay | Admitting: Emergency Medicine

## 2014-02-27 ENCOUNTER — Emergency Department (HOSPITAL_COMMUNITY): Payer: Self-pay

## 2014-02-27 ENCOUNTER — Emergency Department (HOSPITAL_COMMUNITY)
Admission: EM | Admit: 2014-02-27 | Discharge: 2014-02-27 | Disposition: A | Discharge status: Home / Self Care | Payer: Self-pay | Attending: Emergency Medicine | Admitting: Emergency Medicine

## 2014-02-27 DIAGNOSIS — R059 Cough, unspecified: Secondary | ICD-10-CM

## 2014-02-27 DIAGNOSIS — J069 Acute upper respiratory infection, unspecified: Secondary | ICD-10-CM | POA: Insufficient documentation

## 2014-02-27 DIAGNOSIS — Z792 Long term (current) use of antibiotics: Secondary | ICD-10-CM | POA: Insufficient documentation

## 2014-02-27 DIAGNOSIS — R05 Cough: Secondary | ICD-10-CM

## 2014-02-27 MED ORDER — KETOROLAC TROMETHAMINE 60 MG/2ML IM SOLN
60.0000 mg | Freq: Once | INTRAMUSCULAR | Status: AC
  Administered 2014-02-27: 60 mg via INTRAMUSCULAR
  Filled 2014-02-27: qty 2

## 2014-02-27 MED ORDER — BENZONATATE 100 MG PO CAPS: 100.0000 mg | ORAL_CAPSULE | Freq: Three times a day (TID) | ORAL | Status: DC

## 2014-02-27 NOTE — ED Provider Notes (Signed)
CSN: [id number]     Arrival date & time 02/27/14  1444 History   First MD Initiated Contact with Patient 02/27/14 1519     Chief Complaint  Patient presents with  . URI     (Consider location/radiation/quality/duration/timing/severity/associated sxs/prior Treatment) HPI  Myrtice LauthLatia Lipps is a 28 y.o. female without significant past medical history presenting with 4-5 days of sinus congestion as well as rhinorrhea, cough productive of thick yellowish mucus. Patient reports some blood in nasal discharge. Patient denies fevers, chills, chest pain, shortness of breath, nausea, vomiting, abdominal pain. She denies history of smoking, COPD, asthma. Patient has taken some over-the-counter pain medicines with mild relief.   History reviewed. No pertinent past medical history. Past Surgical History  Procedure Laterality Date  . Colposcopy  2010   History reviewed. No pertinent family history. History  Substance Use Topics  . Smoking status: Never Smoker   . Smokeless tobacco: Never Used  . Alcohol Use: No   OB History    Gravida Para Term Preterm AB TAB SAB Ectopic Multiple Living   0 0 0 0 0 0 0 0       Review of Systems  Constitutional: Negative for fever and chills.  HENT: Positive for congestion, rhinorrhea, sinus pressure and sore throat.   Respiratory: Positive for cough. Negative for shortness of breath.   Cardiovascular: Negative for chest pain and palpitations.  Gastrointestinal: Negative for nausea, vomiting and diarrhea.  Musculoskeletal: Negative for back pain and gait problem.  Skin: Negative for rash.  Neurological: Negative for weakness and headaches.      Allergies  Review of patient's allergies indicates no known allergies.  Home Medications   Prior to Admission medications   Medication Sig Start Date End Date Taking? Authorizing Provider  naproxen sodium (ANAPROX) 220 MG tablet Take 220 mg by mouth 2 (two) times daily as needed (for pain).   Yes Historical  Provider, MD  azithromycin (ZITHROMAX Z-PAK) 250 MG tablet Take 1 tablet (250 mg total) by mouth daily. 500mg  PO day 1, then 250mg  PO days 205 01/22/14   Kaitlyn Szekalski, PA-C  benzonatate (TESSALON) 100 MG capsule Take 1 capsule (100 mg total) by mouth every 8 (eight) hours. 02/27/14   Benetta SparVictoria L Wayburn Shaler, PA-C   BP 120/74 mmHg  Pulse 107  Temp(Src) 97.7 F (36.5 C) (Oral)  Resp 18  Ht 5\' 2"  (1.575 m)  Wt 130 lb (58.968 kg)  BMI 23.77 kg/m2  SpO2 99%  LMP 02/27/2014 Physical Exam  Constitutional: She appears well-developed and well-nourished. No distress.  HENT:  Head: Normocephalic and atraumatic.  Nose: Right sinus exhibits no maxillary sinus tenderness and no frontal sinus tenderness. Left sinus exhibits no maxillary sinus tenderness and no frontal sinus tenderness.  Mouth/Throat: Mucous membranes are normal. Posterior oropharyngeal erythema present. No oropharyngeal exudate or posterior oropharyngeal edema.  Eyes: Conjunctivae and EOM are normal. Right eye exhibits no discharge. Left eye exhibits no discharge.  Neck: Normal range of motion. Neck supple.  Cardiovascular: Normal rate, regular rhythm and normal heart sounds.   Pulmonary/Chest: Effort normal and breath sounds normal. No respiratory distress. She has no wheezes. She has no rales.  Abdominal: Soft. Bowel sounds are normal. She exhibits no distension. There is no tenderness.  Lymphadenopathy:    She has cervical adenopathy.  Neurological: She is alert.  Skin: Skin is warm and dry. She is not diaphoretic.  Nursing note and vitals reviewed.   ED Course  Procedures (including critical care time) Labs Review  Labs Reviewed - No data to display  Imaging Review Dg Chest 2 View  02/27/2014   CLINICAL DATA:  28 year old female with history productive cough for the past 3 days. No history of fever.  EXAM: CHEST  2 VIEW  COMPARISON:  No priors.  FINDINGS: Lung volumes are normal. No consolidative airspace disease. No  pleural effusions. No pneumothorax. No pulmonary nodule or mass noted. Pulmonary vasculature and the cardiomediastinal silhouette are within normal limits.  IMPRESSION: No radiographic evidence of acute cardiopulmonary disease.   Electronically Signed   By: Trudie Reedaniel  Entrikin M.D.   On: 02/27/2014 16:17     EKG Interpretation None      MDM   Final diagnoses:  Cough  URI (upper respiratory infection)   Pt CXR negative for acute infiltrate. Patients symptoms are consistent with URI, likely viral etiology. Discussed that antibiotics are not indicated for viral infections. Pt will be discharged with symptomatic treatment as well as Sudafed for sinus congestion. Patient given cough suppressant. Verbalizes understanding and is agreeable with plan. Pt is hemodynamically stable & in NAD prior to dc.  Discussed return precautions with patient. Discussed all results and patient verbalizes understanding and agrees with plan.   Louann SjogrenVictoria L Ritvik Mczeal, PA-C 02/27/14 1753  Enid SkeensJoshua M Zavitz, MD 03/01/14 [phone]

## 2014-02-27 NOTE — Discharge Instructions (Signed)
Return to the emergency room with worsening of symptoms, new symptoms or with symptoms that are concerning , especially fevers, stiff neck, worsening headache, nausea/vomiting, visual changes or slurred speech, chest pain, shortness of breath, cough with thick colored mucous or blood Drink plenty of fluids with electrolytes especially Gatorade. OTC cold medications such as mucinex, nyquil, dayquil are recommended. Chloraseptic for sore throat. Go over-the-counter and asked the pharmacist for Sudafed. Take in the morning because it can make you have problems with sleep.   Cough, Adult  A cough is a reflex that helps clear your throat and airways. It can help heal the body or may be a reaction to an irritated airway. A cough may only last 2 or 3 weeks (acute) or may last more than 8 weeks (chronic).  CAUSES Acute cough:  Viral or bacterial infections. Chronic cough:  Infections.  Allergies.  Asthma.  Post-nasal drip.  Smoking.  Heartburn or acid reflux.  Some medicines.  Chronic lung problems (COPD).  Cancer. SYMPTOMS   Cough.  Fever.  Chest pain.  Increased breathing rate.  High-pitched whistling sound when breathing (wheezing).  Colored mucus that you cough up (sputum). TREATMENT   A bacterial cough may be treated with antibiotic medicine.  A viral cough must run its course and will not respond to antibiotics.  Your caregiver may recommend other treatments if you have a chronic cough. HOME CARE INSTRUCTIONS   Only take over-the-counter or prescription medicines for pain, discomfort, or fever as directed by your caregiver. Use cough suppressants only as directed by your caregiver.  Use a cold steam vaporizer or humidifier in your bedroom or home to help loosen secretions.  Sleep in a semi-upright position if your cough is worse at night.  Rest as needed.  Stop smoking if you smoke. SEEK IMMEDIATE MEDICAL CARE IF:   You have pus in your sputum.  Your  cough starts to worsen.  You cannot control your cough with suppressants and are losing sleep.  You begin coughing up blood.  You have difficulty breathing.  You develop pain which is getting worse or is uncontrolled with medicine.  You have a fever. MAKE SURE YOU:   Understand these instructions.  Will watch your condition.  Will get help right away if you are not doing well or get worse. Document Released: 08/28/2010 Document Revised: 05/24/2011 Document Reviewed: 08/28/2010 [facility] Patient Information 2015 [employer] [place]. This information is not intended to replace advice given to you by your health care provider. Make sure you discuss any questions you have with your health care provider.

## 2014-02-27 NOTE — ED Notes (Signed)
Pt sts URI sx with congestion x 4 days

## 2014-03-01 ENCOUNTER — Encounter: Payer: Self-pay | Admitting: *Deleted

## 2014-05-16 ENCOUNTER — Emergency Department (HOSPITAL_COMMUNITY)
Admission: EM | Admit: 2014-05-16 | Discharge: 2014-05-16 | Disposition: A | Discharge status: Home / Self Care | Payer: Self-pay | Attending: Emergency Medicine | Admitting: Emergency Medicine

## 2014-05-16 ENCOUNTER — Encounter (HOSPITAL_COMMUNITY): Payer: Self-pay | Admitting: Emergency Medicine

## 2014-05-16 DIAGNOSIS — J029 Acute pharyngitis, unspecified: Secondary | ICD-10-CM | POA: Insufficient documentation

## 2014-05-16 DIAGNOSIS — Z792 Long term (current) use of antibiotics: Secondary | ICD-10-CM | POA: Insufficient documentation

## 2014-05-16 DIAGNOSIS — Z720 Tobacco use: Secondary | ICD-10-CM | POA: Insufficient documentation

## 2014-05-16 LAB — RAPID STREP SCREEN (MED CTR MEBANE ONLY): Streptococcus, Group A Screen (Direct): NEGATIVE

## 2014-05-16 NOTE — Discharge Instructions (Signed)

## 2014-05-16 NOTE — ED Provider Notes (Signed)
CSN: [id number]     Arrival date & time 05/16/14  0848 History   First MD Initiated Contact with Patient 05/16/14 [phone]     Chief Complaint  Patient presents with  . Sore Throat     (Consider location/radiation/quality/duration/timing/severity/associated sxs/prior Treatment) HPI Comments: Pt states that she was exposed to strep  Patient is a 29 y.o. female presenting with pharyngitis. The history is provided by the patient. No language interpreter was used.  Sore Throat This is a new problem. The current episode started today. The problem occurs constantly. The problem has been unchanged. Associated symptoms include a sore throat. Pertinent negatives include no fever. Nothing aggravates the symptoms. She has tried nothing for the symptoms.    History reviewed. No pertinent past medical history. Past Surgical History  Procedure Laterality Date  . Colposcopy  2010   No family history on file. History  Substance Use Topics  . Smoking status: Current Some Day Smoker    Types: Cigarettes  . Smokeless tobacco: Never Used  . Alcohol Use: No   OB History    Gravida Para Term Preterm AB TAB SAB Ectopic Multiple Living   0 0 0 0 0 0 0 0       Review of Systems  Constitutional: Negative for fever.  HENT: Positive for sore throat.   All other systems reviewed and are negative.     Allergies  Review of patient's allergies indicates no known allergies.  Home Medications   Prior to Admission medications   Medication Sig Start Date End Date Taking? Authorizing Provider  azithromycin (ZITHROMAX Z-PAK) 250 MG tablet Take 1 tablet (250 mg total) by mouth daily. 500mg  PO day 1, then 250mg  PO days 205 01/22/14   Kaitlyn Szekalski, PA-C  benzonatate (TESSALON) 100 MG capsule Take 1 capsule (100 mg total) by mouth every 8 (eight) hours. 02/27/14   Louann SjogrenVictoria L Creech, PA-C  naproxen sodium (ANAPROX) 220 MG tablet Take 220 mg by mouth 2 (two) times daily as needed (for pain).    Historical  Provider, MD   BP 116/79 mmHg  Pulse 76  Temp(Src) 98.1 F (36.7 C) (Oral)  Resp 18  SpO2 96% Physical Exam  Constitutional: She is oriented to person, place, and time. She appears well-developed and well-nourished.  HENT:  Right Ear: External ear normal.  Left Ear: External ear normal.  Mouth/Throat: Posterior oropharyngeal erythema present.  Cardiovascular: Normal rate and regular rhythm.   Pulmonary/Chest: Effort normal and breath sounds normal.  Musculoskeletal: Normal range of motion.  Neurological: She is alert and oriented to person, place, and time.  Skin: Skin is warm and dry.  Nursing note and vitals reviewed.   ED Course  Procedures (including critical care time) Labs Review Labs Reviewed  RAPID STREP SCREEN  CULTURE, GROUP A STREP    Imaging Review No results found.   EKG Interpretation None      MDM   Final diagnoses:  Pharyngitis    Strep is negative. Pt is okay to follow up as needed she can do symptomatic treatment    Teressa LowerVrinda Izumi Mixon, NP 05/16/14 [id number]  Benny LennertJoseph L Zammit, MD 05/16/14 [phone]

## 2014-05-16 NOTE — ED Notes (Signed)
Patient states saw grandmother on Monday and she had strep.  Patient presents with sore throat at this time.  Denies any other symptoms.

## 2014-05-19 LAB — CULTURE, GROUP A STREP: Strep A Culture: NEGATIVE

## 2014-11-25 DIAGNOSIS — Z8619 Personal history of other infectious and parasitic diseases: Secondary | ICD-10-CM | POA: Insufficient documentation

## 2014-12-11 DIAGNOSIS — F3181 Bipolar II disorder: Secondary | ICD-10-CM | POA: Insufficient documentation

## 2014-12-11 DIAGNOSIS — Z62810 Personal history of physical and sexual abuse in childhood: Secondary | ICD-10-CM | POA: Insufficient documentation

## 2015-02-23 ENCOUNTER — Encounter: Payer: Self-pay | Admitting: Emergency Medicine

## 2015-02-23 ENCOUNTER — Emergency Department
Admission: EM | Admit: 2015-02-23 | Discharge: 2015-02-23 | Disposition: A | Discharge status: Home / Self Care | Payer: Self-pay | Attending: Emergency Medicine | Admitting: Emergency Medicine

## 2015-02-23 DIAGNOSIS — S61411A Laceration without foreign body of right hand, initial encounter: Secondary | ICD-10-CM | POA: Insufficient documentation

## 2015-02-23 DIAGNOSIS — F17210 Nicotine dependence, cigarettes, uncomplicated: Secondary | ICD-10-CM | POA: Insufficient documentation

## 2015-02-23 DIAGNOSIS — Y998 Other external cause status: Secondary | ICD-10-CM | POA: Insufficient documentation

## 2015-02-23 DIAGNOSIS — Y9289 Other specified places as the place of occurrence of the external cause: Secondary | ICD-10-CM | POA: Insufficient documentation

## 2015-02-23 DIAGNOSIS — Y289XXA Contact with unspecified sharp object, undetermined intent, initial encounter: Secondary | ICD-10-CM | POA: Insufficient documentation

## 2015-02-23 DIAGNOSIS — Y93G3 Activity, cooking and baking: Secondary | ICD-10-CM | POA: Insufficient documentation

## 2015-02-23 DIAGNOSIS — Z792 Long term (current) use of antibiotics: Secondary | ICD-10-CM | POA: Insufficient documentation

## 2015-02-23 DIAGNOSIS — Z79899 Other long term (current) drug therapy: Secondary | ICD-10-CM | POA: Insufficient documentation

## 2015-02-23 MED ORDER — LIDOCAINE HCL (PF) 1 % IJ SOLN
5.0000 mL | Freq: Once | INTRAMUSCULAR | Status: AC
  Administered 2015-02-23: 5 mL
  Filled 2015-02-23: qty 5

## 2015-02-23 NOTE — ED Notes (Signed)
Pt cut right hand between thumb and 2nd finger while cooking.  Reports some numbness/tingling in right thumb.  Area is bleeding.

## 2015-02-23 NOTE — ED Notes (Signed)
Clean gauze dressing applied over sutures and wrapped to secure.

## 2015-02-23 NOTE — ED Notes (Signed)
Discussed discharge instructions and follow-up care with patient. No questions or concerns at this time. Pt stable at discharge.  

## 2015-02-23 NOTE — ED Provider Notes (Signed)
[facility] Emergency Department Provider Note ____________________________________________  Time seen: 1858  I have reviewed the triage vital signs and the nursing notes.  HISTORY  Chief Complaint  Extremity Laceration  HPI Susan Mcknight is a 29 y.o. female, left hand dominant reports to the ED for evaluation of injury sustained to the right hand. She describes accidentally stabbed herself in the webspace between the thumb and index finger while cooking. She reports pain and some bleeding to the area. She also notes some local tingling and numbness to the right hand. She denies any other injury at this time, and reports a current tetanus status. She rates her discomfort at a 10/10 in triage.  No past medical history on file.  There are no active problems to display for this patient.   Past Surgical History  Procedure Laterality Date  . Colposcopy  2010    Current Outpatient Rx  Name  Route  Sig  Dispense  Refill  . azithromycin (ZITHROMAX Z-PAK) 250 MG tablet   Oral   Take 1 tablet (250 mg total) by mouth daily. 500mg  PO day 1, then 250mg  PO days 205   6 tablet   0   . benzonatate (TESSALON) 100 MG capsule   Oral   Take 1 capsule (100 mg total) by mouth every 8 (eight) hours.   21 capsule   0   . naproxen sodium (ANAPROX) 220 MG tablet   Oral   Take 220 mg by mouth 2 (two) times daily as needed (for pain).          Allergies Review of patient's allergies indicates no known allergies.  No family history on file.  Social History Social History  Substance Use Topics  . Smoking status: Current Some Day Smoker    Types: Cigarettes  . Smokeless tobacco: Never Used  . Alcohol Use: No   Review of Systems  Constitutional: Negative for fever. Eyes: Negative for visual changes. ENT: Negative for sore throat. Cardiovascular: Negative for chest pain. Respiratory: Negative for shortness of breath. Gastrointestinal: Negative for abdominal  pain, vomiting and diarrhea. Genitourinary: Negative for dysuria. Musculoskeletal: Negative for back pain. Skin: Negative for rash. Right hand laceration as above. Neurological: Negative for headaches, focal weakness or numbness. ____________________________________________  PHYSICAL EXAM:  VITAL SIGNS: ED Triage Vitals  Enc Vitals Group     BP 02/23/15 1839 140/81 mmHg     Pulse Rate 02/23/15 1839 100     Resp 02/23/15 1839 18     Temp 02/23/15 1839 98 F (36.7 C)     Temp Source 02/23/15 1839 Oral     SpO2 02/23/15 1839 96 %     Weight --      Height --      Head Cir --      Peak Flow --      Pain Score 02/23/15 1840 10     Pain Loc --      Pain Edu? --      Excl. in GC? --    Constitutional: Alert and oriented. Well appearing and in no distress. Head: Normocephalic and atraumatic.      Eyes: Conjunctivae are normal. PERRL. Normal extraocular movements      Ears: Canals clear. TMs intact bilaterally.   Nose: No congestion/rhinorrhea.   Mouth/Throat: Mucous membranes are moist.   Neck: Supple. No thyromegaly. Hematological/Lymphatic/Immunological: No cervical lymphadenopathy. Cardiovascular: Normal rate, regular rhythm.  Respiratory: Normal respiratory effort. No wheezes/rales/rhonchi. Gastrointestinal: Soft and nontender. No distention. Musculoskeletal:  Normal composite fist to the right hand laceration noted to the first web space. Nontender with normal range of motion in all extremities.  Neurologic: Normal gross sensation normal intrinsic and opposition testing. Normal gait without ataxia. Normal speech and language. No gross focal neurologic deficits are appreciated. Skin:  Skin is warm, dry and intact. No rash noted. Psychiatric: Mood and affect are normal. Patient exhibits appropriate insight and judgment. ____________________________________________  PROCEDURES  LACERATION REPAIR Performed by: Lissa Hoard Authorized by: Lissa Hoard Consent: Verbal consent obtained. Risks and benefits: risks, benefits and alternatives were discussed Consent given by: patient Patient identity confirmed: provided demographic data Prepped and Draped in normal sterile fashion Wound explored  Laceration Location: right hand  Laceration Length: 4 cm  No Foreign Bodies seen or palpated  Anesthesia: local infiltration  Local anesthetic: lidocaine 1% w/o epinephrine  Anesthetic total: 3 ml  Irrigation method: syringe Amount of cleaning: standard  Skin closure: 4-0 nylon  Number of sutures: 5  Technique: interrupted  Patient tolerance: Patient tolerated the procedure well with no immediate complications.  ____________________________________________  INITIAL IMPRESSION / ASSESSMENT AND PLAN / ED COURSE  Laceration to the right hand status post suture repair. Patient's discharge with wound care instructions and encouraged follow-up with local community clinic, or urgent care center for suture removal in 10-14 days. ____________________________________________  FINAL CLINICAL IMPRESSION(S) / ED DIAGNOSES  Final diagnoses:  Laceration of right hand, initial encounter      Lissa Hoard, PA-C 02/23/15 2010  Emily Filbert, MD 02/23/15 2048

## 2015-02-23 NOTE — Discharge Instructions (Signed)
Laceration Care, Adult  A laceration is a cut that goes through all layers of the skin. The cut also goes into the tissue that is right under the skin. Some cuts heal on their own. Others need to be closed with stitches (sutures), staples, skin adhesive strips, or wound glue. Taking care of your cut lowers your risk of infection and helps your cut to heal better.  HOW TO TAKE CARE OF YOUR CUT  For stitches or staples:  · Keep the wound clean and dry.  · If you were given a bandage (dressing), you should change it at least one time per day or as told by your doctor. You should also change it if it gets wet or dirty.  · Keep the wound completely dry for the first 24 hours or as told by your doctor. After that time, you may take a shower or a bath. However, make sure that the wound is not soaked in water until after the stitches or staples have been removed.  · Clean the wound one time each day or as told by your doctor:    Wash the wound with soap and water.    Rinse the wound with water until all of the soap comes off.    Pat the wound dry with a clean towel. Do not rub the wound.  · After you clean the wound, put a thin layer of antibiotic ointment on it as told by your doctor. This ointment:    Helps to prevent infection.    Keeps the bandage from sticking to the wound.  · Have your stitches or staples removed as told by your doctor.  If your doctor used skin adhesive strips:   · Keep the wound clean and dry.  · If you were given a bandage, you should change it at least one time per day or as told by your doctor. You should also change it if it gets dirty or wet.  · Do not get the skin adhesive strips wet. You can take a shower or a bath, but be careful to keep the wound dry.  · If the wound gets wet, pat it dry with a clean towel. Do not rub the wound.  · Skin adhesive strips fall off on their own. You can trim the strips as the wound heals. Do not remove any strips that are still stuck to the wound. They will  fall off after a while.  If your doctor used wound glue:  · Try to keep your wound dry, but you may briefly wet it in the shower or bath. Do not soak the wound in water, such as by swimming.  · After you take a shower or a bath, gently pat the wound dry with a clean towel. Do not rub the wound.  · Do not do any activities that will make you really sweaty until the skin glue has fallen off on its own.  · Do not apply liquid, cream, or ointment medicine to your wound while the skin glue is still on.  · If you were given a bandage, you should change it at least one time per day or as told by your doctor. You should also change it if it gets dirty or wet.  · If a bandage is placed over the wound, do not let the tape for the bandage touch the skin glue.  · Do not pick at the glue. The skin glue usually stays on for 5-10 days. Then, it   or when wound glue stays in place and the wound is healed. Make sure to wear a sunscreen of at least 30 SPF.  Take over-the-counter and prescription medicines only as told by your doctor.  If you were given antibiotic medicine or ointment, take or apply it as told by your doctor. Do not stop using the antibiotic even if your wound is getting better.  Do not scratch or pick at the wound.  Keep all follow-up visits as told by your doctor. This is important.  Check your wound every day for signs of infection. Watch for:  Redness, swelling, or pain.  Fluid, blood, or pus.  Raise (elevate) the injured area above the level of your heart while you are sitting or lying down, if possible. GET HELP IF:  You got a tetanus shot and you have any of these problems at the injection site:  Swelling.  Very bad pain.  Redness.  Bleeding.  You have a fever.  A wound that was  closed breaks open.  You notice a bad smell coming from your wound or your bandage.  You notice something coming out of the wound, such as wood or glass.  Medicine does not help your pain.  You have more redness, swelling, or pain at the site of your wound.  You have fluid, blood, or pus coming from your wound.  You notice a change in the color of your skin near your wound.  You need to change the bandage often because fluid, blood, or pus is coming from the wound.  You start to have a new rash.  You start to have numbness around the wound. GET HELP RIGHT AWAY IF:  You have very bad swelling around the wound.  Your pain suddenly gets worse and is very bad.  You notice painful lumps near the wound or on skin that is anywhere on your body.  You have a red streak going away from your wound.  The wound is on your hand or foot and you cannot move a finger or toe like you usually can.  The wound is on your hand or foot and you notice that your fingers or toes look pale or bluish.   This information is not intended to replace advice given to you by your health care provider. Make sure you discuss any questions you have with your health care provider.   Document Released: 08/18/2007 Document Revised: 07/16/2014 Document Reviewed: 02/25/2014 Elsevier Interactive Patient Education [employer]2016 [employer]  Keep the wound clean, dry, and covered.  Wash regularly with soap & water. [facility], [facility], or a local urgent care for suture removal in 10-14 days.

## 2015-02-26 ENCOUNTER — Telehealth (HOSPITAL_COMMUNITY): Payer: Self-pay | Admitting: *Deleted

## 2015-02-26 NOTE — Telephone Encounter (Signed)
Attempted to call patient to schedule follow up appointment with [facility]. No one answered phone. Left voicemail for patient to call me back. 

## 2015-05-26 ENCOUNTER — Emergency Department
Admission: EM | Admit: 2015-05-26 | Discharge: 2015-05-26 | Disposition: A | Discharge status: Home / Self Care | Payer: Self-pay | Attending: Emergency Medicine | Admitting: Emergency Medicine

## 2015-05-26 ENCOUNTER — Encounter: Payer: Self-pay | Admitting: Emergency Medicine

## 2015-05-26 DIAGNOSIS — F17210 Nicotine dependence, cigarettes, uncomplicated: Secondary | ICD-10-CM | POA: Insufficient documentation

## 2015-05-26 DIAGNOSIS — J209 Acute bronchitis, unspecified: Secondary | ICD-10-CM

## 2015-05-26 MED ORDER — BENZONATATE 100 MG PO CAPS: 200.0000 mg | ORAL_CAPSULE | Freq: Three times a day (TID) | ORAL | Status: DC | PRN

## 2015-05-26 MED ORDER — AZITHROMYCIN 250 MG PO TABS: ORAL_TABLET | ORAL | Status: DC

## 2015-05-26 NOTE — ED Notes (Signed)
Cold sx's for the several weeks  Cough is occasional prod but dry mostly   No relief with OTC meds

## 2015-05-26 NOTE — ED Provider Notes (Signed)
[facility] Emergency Department Provider Note  ____________________________________________  Time seen: Approximately 8:08 AM  I have reviewed the triage vital signs and the nursing notes.   HISTORY  Chief Complaint Cough    HPI Susan Mcknight is a 30 y.o. female , NAD, presents to the emergency department with cold symptoms and cough for couple of weeks. Notes her cough can be cyclical in nature. Has been taking over-the-counter medications with no relief of symptoms. Denies any fever, chills, body aches. His not had any abdominal pain, nausea, vomiting. Denies overt chest pain or back pain. Does have some muscular pain with cough due to length of time. Denies any shortness of breath, wheezing, dizziness, visual changes. Has had no headaches.   History reviewed. No pertinent past medical history.  There are no active problems to display for this patient.   Past Surgical History  Procedure Laterality Date  . Colposcopy  2010    Current Outpatient Rx  Name  Route  Sig  Dispense  Refill  . azithromycin (ZITHROMAX Z-PAK) 250 MG tablet      Take 2 tablets (500 mg) on  Day 1,  followed by 1 tablet (250 mg) once daily on Days 2 through 5.   6 each   0   . benzonatate (TESSALON PERLES) 100 MG capsule   Oral   Take 2 capsules (200 mg total) by mouth 3 (three) times daily as needed for cough.   30 capsule   0     Allergies Review of patient's allergies indicates no known allergies.  No family history on file.  Social History Social History  Substance Use Topics  . Smoking status: Current Some Day Smoker    Types: Cigarettes  . Smokeless tobacco: Never Used  . Alcohol Use: No     Review of Systems  Constitutional: No fever/chills, sweats Eyes: No visual changes. No discharge ENT: No sore throat, nasal congestion, runny nose, ear pain. Cardiovascular: No chest pain. Respiratory: Positive dry cough with mild chest congestion. No shortness  of breath. No wheezing.  Gastrointestinal: No abdominal pain.  No nausea, vomiting.  No diarrhea.   Musculoskeletal: Negative for back pain. No general myalgias. Skin: Negative for rash. Neurological: Negative for headaches, focal weakness or numbness. 10-point ROS otherwise negative.  ____________________________________________   PHYSICAL EXAM:  VITAL SIGNS: ED Triage Vitals  Enc Vitals Group     BP 05/26/15 0801 121/67 mmHg     Pulse Rate 05/26/15 0801 78     Resp 05/26/15 0801 18     Temp 05/26/15 0801 97.7 F (36.5 C)     Temp Source 05/26/15 0801 Oral     SpO2 05/26/15 0801 100 %     Weight 05/26/15 0801 141 lb (63.957 kg)     Height 05/26/15 0801  (1.575 m)     Head Cir --      Peak Flow --      Pain Score --      Pain Loc --      Pain Edu? --      Excl. in GC? --     Constitutional: Alert and oriented. Well appearing and in no acute distress. Eyes: Conjunctivae are normal. PERRL. EOMI without pain.  Head: Atraumatic. ENT:      Ears: TMs visualized bilaterally without erythema, bulging, perforation, effusion.      Nose: No congestion/rhinnorhea.      Mouth/Throat: Mucous membranes are moist. Pharynx without erythema, exudate, swelling. Mild clear  postnasal drip. Neck: No stridor. Supple with full range of motion. Hematological/Lymphatic/Immunilogical: No cervical lymphadenopathy. Cardiovascular: Normal rate, regular rhythm. Normal S1 and S2.  Good peripheral circulation. Respiratory: Normal respiratory effort without tachypnea or retractions. Lungs CTAB. Neurologic:  Normal speech and language. No gross focal neurologic deficits are appreciated.  Skin:  Skin is warm, dry and intact. No rash noted. Psychiatric: Mood and affect are normal. Speech and behavior are normal. Patient exhibits appropriate insight and judgement.   ____________________________________________    LABS  None  ____________________________________________  EKG  None ____________________________________________  RADIOLOGY  None ____________________________________________    PROCEDURES  Procedure(s) performed: None    Medications - No data to display   ____________________________________________   INITIAL IMPRESSION / ASSESSMENT AND PLAN / ED COURSE  Patient's diagnosis is consistent with acute bronchitis after upper respiratory infection. Patient will be discharged home with prescriptions for azithromycin and Tessalon Perles to take as directed. Patient is to [facility] if symptoms persist past this treatment course. Patient is given ED precautions to return to the ED for any worsening or new symptoms.    ____________________________________________  FINAL CLINICAL IMPRESSION(S) / ED DIAGNOSES  Final diagnoses:  Acute bronchitis, unspecified organism      NEW MEDICATIONS STARTED DURING THIS VISIT:  Discharge Medication List as of 05/26/2015  8:20 AM           Hope PigeonJami L Hagler, PA-C 05/26/15 [id number]  Jene Everyobert Kinner, MD 05/26/15 1213

## 2015-05-26 NOTE — Discharge Instructions (Signed)

## 2015-08-15 IMAGING — DX DG CHEST 2V
2 series · 2 of 2 positions shown · non-contrast
Comparison: No priors.

CLINICAL DATA: 28-year-old female with history productive cough for
the past 3 days. No history of fever.

EXAM:
CHEST  2 VIEW

[chest pa]
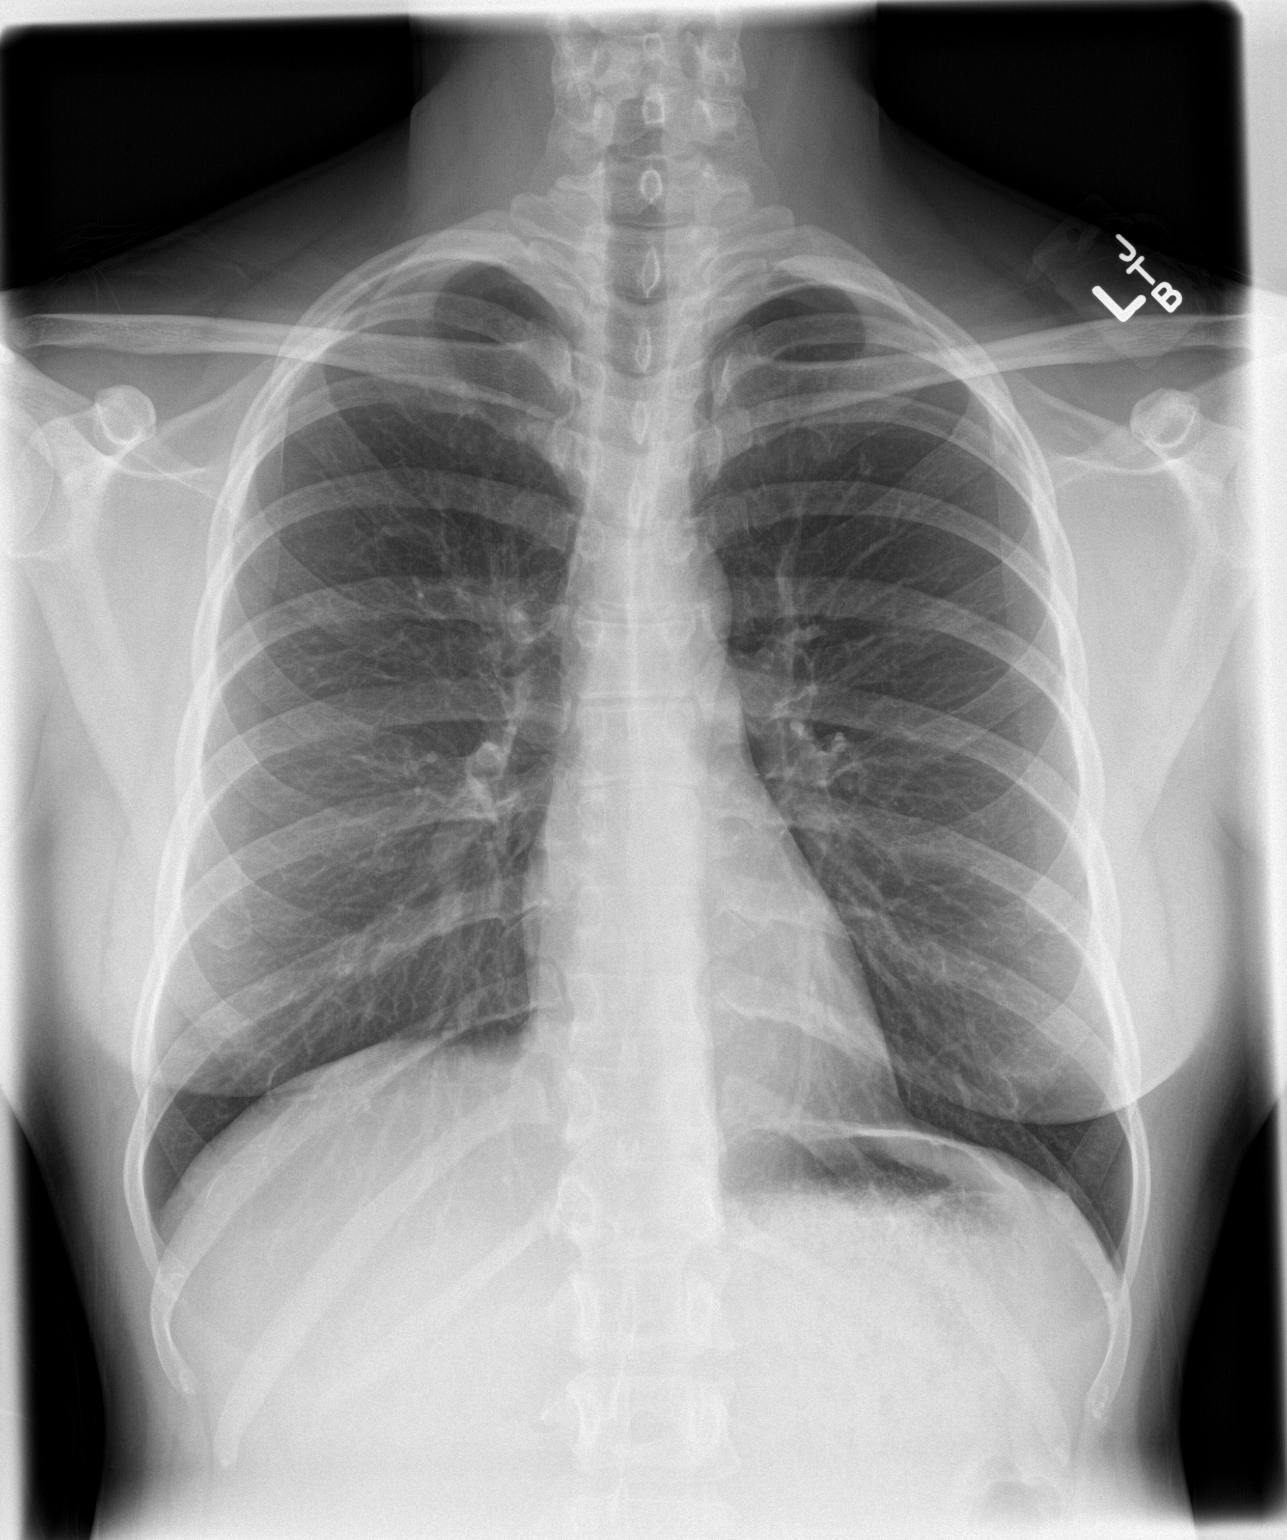

[chest lat]
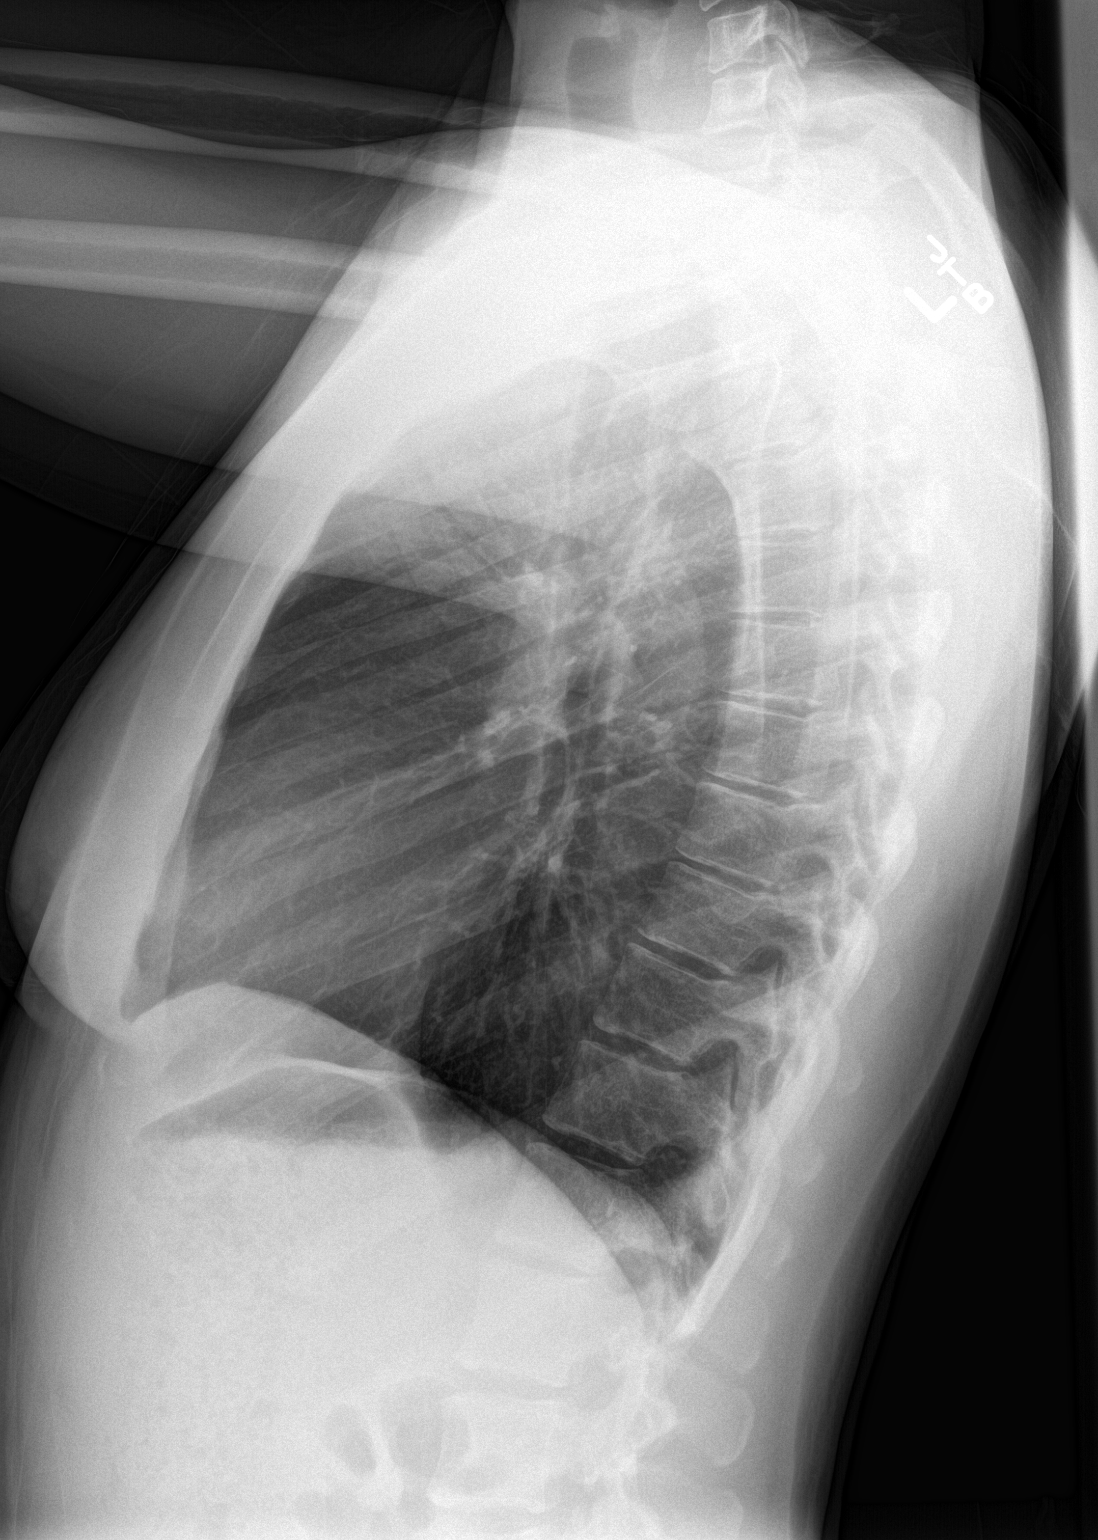

[2 of 2 positions shown; findings below may reference images not displayed]

FINDINGS: Lung volumes are normal. No consolidative airspace disease. No
pleural effusions. No pneumothorax. No pulmonary nodule or mass
noted. Pulmonary vasculature and the cardiomediastinal silhouette
are within normal limits.
IMPRESSION: No radiographic evidence of acute cardiopulmonary disease.

## 2015-09-09 ENCOUNTER — Encounter (HOSPITAL_COMMUNITY): Payer: Self-pay | Admitting: *Deleted

## 2015-09-12 ENCOUNTER — Ambulatory Visit (HOSPITAL_COMMUNITY): Payer: Self-pay

## 2015-09-22 ENCOUNTER — Telehealth (HOSPITAL_COMMUNITY): Payer: Self-pay | Admitting: *Deleted

## 2015-09-22 NOTE — Telephone Encounter (Signed)
Telephoned home # and disconnected number.

## 2015-09-29 ENCOUNTER — Telehealth (HOSPITAL_COMMUNITY): Payer: Self-pay | Admitting: *Deleted

## 2015-09-29 NOTE — Telephone Encounter (Signed)
Telephoned patient's home # and not in service

## 2015-10-03 ENCOUNTER — Encounter: Payer: Self-pay | Admitting: Obstetrics & Gynecology

## 2016-01-01 ENCOUNTER — Ambulatory Visit (HOSPITAL_COMMUNITY): Payer: Self-pay

## 2016-01-09 ENCOUNTER — Encounter: Payer: Self-pay | Admitting: Family Medicine

## 2016-02-19 ENCOUNTER — Encounter (HOSPITAL_COMMUNITY): Payer: Self-pay | Admitting: *Deleted

## 2016-02-19 ENCOUNTER — Emergency Department (HOSPITAL_COMMUNITY)
Admission: EM | Admit: 2016-02-19 | Discharge: 2016-02-20 | Disposition: A | Discharge status: Home / Self Care | Payer: Self-pay | Attending: Emergency Medicine | Admitting: Emergency Medicine

## 2016-02-19 DIAGNOSIS — N390 Urinary tract infection, site not specified: Secondary | ICD-10-CM

## 2016-02-19 DIAGNOSIS — R300 Dysuria: Secondary | ICD-10-CM

## 2016-02-19 DIAGNOSIS — A599 Trichomoniasis, unspecified: Secondary | ICD-10-CM

## 2016-02-19 DIAGNOSIS — B9689 Other specified bacterial agents as the cause of diseases classified elsewhere: Secondary | ICD-10-CM

## 2016-02-19 DIAGNOSIS — A5901 Trichomonal vulvovaginitis: Secondary | ICD-10-CM | POA: Insufficient documentation

## 2016-02-19 DIAGNOSIS — F17210 Nicotine dependence, cigarettes, uncomplicated: Secondary | ICD-10-CM | POA: Insufficient documentation

## 2016-02-19 DIAGNOSIS — N760 Acute vaginitis: Secondary | ICD-10-CM

## 2016-02-19 LAB — WET PREP, GENITAL
Sperm: NONE SEEN
Yeast Wet Prep HPF POC: NONE SEEN

## 2016-02-19 LAB — COMPREHENSIVE METABOLIC PANEL
ALBUMIN: 4.3 g/dL (ref 3.5–5.0)
ALK PHOS: 67 U/L (ref 38–126)
ALT: 17 U/L (ref 14–54)
AST: 23 U/L (ref 15–41)
Anion gap: 10 (ref 5–15)
BUN: 12 mg/dL (ref 6–20)
CHLORIDE: 104 mmol/L (ref 101–111)
CO2: 24 mmol/L (ref 22–32)
CREATININE: 0.93 mg/dL (ref 0.44–1.00)
Calcium: 9.6 mg/dL (ref 8.9–10.3)
GFR calc non Af Amer: >60 mL/min (ref >60)
GLUCOSE: 98 mg/dL (ref 65–99)
Potassium: 3.7 mmol/L (ref 3.5–5.1)
SODIUM: 138 mmol/L (ref 135–145)
Total Bilirubin: 0.7 mg/dL (ref 0.3–1.2)
Total Protein: 7.2 g/dL (ref 6.5–8.1)

## 2016-02-19 LAB — I-STAT BETA HCG BLOOD, ED (MC, WL, AP ONLY): I-stat hCG, quantitative: <5 m[IU]/mL (ref <5)

## 2016-02-19 LAB — CBC
HCT: 40.7 % (ref 36.0–46.0)
Hemoglobin: 13.8 g/dL (ref 12.0–15.0)
MCH: 32 pg (ref 26.0–34.0)
MCHC: 33.9 g/dL (ref 30.0–36.0)
MCV: 94.4 fL (ref 78.0–100.0)
PLATELETS: 370 10*3/uL (ref 150–400)
RBC: 4.31 MIL/uL (ref 3.87–5.11)
RDW: 13.2 % (ref 11.5–15.5)
WBC: 10.1 10*3/uL (ref 4.0–10.5)

## 2016-02-19 LAB — URINALYSIS, ROUTINE W REFLEX MICROSCOPIC
Bacteria, UA: NONE SEEN
Bilirubin Urine: NEGATIVE
Glucose, UA: NEGATIVE mg/dL
Hgb urine dipstick: NEGATIVE
KETONES UR: 5 mg/dL — AB
Nitrite: NEGATIVE
PH: 6 (ref 5.0–8.0)
Protein, ur: NEGATIVE mg/dL
Specific Gravity, Urine: 1.019 (ref 1.005–1.030)

## 2016-02-19 LAB — LIPASE, BLOOD: LIPASE: 17 U/L (ref 11–51)

## 2016-02-19 MED ORDER — FENTANYL CITRATE (PF) 100 MCG/2ML IJ SOLN: 50.0000 ug | Freq: Once | INTRAMUSCULAR | Status: DC

## 2016-02-19 MED ORDER — FENTANYL CITRATE (PF) 100 MCG/2ML IJ SOLN
50.0000 ug | Freq: Once | INTRAMUSCULAR | Status: AC
  Administered 2016-02-19: 50 ug via INTRAMUSCULAR
  Filled 2016-02-19: qty 2

## 2016-02-19 NOTE — ED Triage Notes (Signed)
Per EMS, pt is homeless, reports tingling when urinating with low abd pain.  Denies hematuria.  Sxs onset is Friday.  Rates pain 9/10.

## 2016-02-19 NOTE — ED Provider Notes (Addendum)
[facility]-EMERGENCY DEPT Provider Note   CSN: [id number] Arrival date & time: 02/19/16  1652     History   Chief Complaint Chief Complaint  Patient presents with  . Abdominal Pain  . Dysuria    HPI Susan Mcknight is a 30 y.o. female.   Abdominal Pain   Associated symptoms include dysuria. Pertinent negatives include nausea, vomiting and hematuria.  Dysuria   This is a new problem. Episode onset: 1 week. The problem has been gradually worsening. The quality of the pain is described as burning. The pain is moderate. There has been no fever. Associated symptoms include discharge and urgency. Pertinent negatives include no chills, no sweats, no nausea, no vomiting and no hematuria. Her past medical history does not include recurrent UTIs.   LMP: spotting since Oct. On Depoprovera.  Prior GC/Chlam and Ivery Qualerich; last in June. Has new partner and having unprotected sex.  During history taking, pt is labile and states that she does not wan to wake up anymore. States she has racing thoughts. Reports being told she has Bipolar and not on medications. Admits to prior suicide attempts. No active SI at this time.no HI or AVH. States she feels like she doesn't belong.  History reviewed. No pertinent past medical history.  There are no active problems to display for this patient.   Past Surgical History:  Procedure Laterality Date  . COLPOSCOPY  2010    OB History    Gravida Para Term Preterm AB Living   0 0 0 0 0     SAB TAB Ectopic Multiple Live Births   0 0 0           Home Medications    Prior to Admission medications   Medication Sig Start Date End Date Taking? Authorizing Provider  cephALEXin (KEFLEX) 500 MG capsule Take 1 capsule (500 mg total) by mouth 2 (two) times daily. 02/20/16 02/25/16  Nira ConnPedro Eduardo Rae Plotner, MD  metroNIDAZOLE (FLAGYL) 500 MG tablet Take 1 tablet (500 mg total) by mouth 2 (two) times daily. 02/20/16 02/27/16  Nira ConnPedro Eduardo Kiarah Eckstein, MD    Family  History History reviewed. No pertinent family history.  Social History Social History  Substance Use Topics  . Smoking status: Current Some Day Smoker    Types: Cigarettes  . Smokeless tobacco: Never Used  . Alcohol use No     Allergies   Patient has no known allergies.   Review of Systems Review of Systems  Constitutional: Negative for chills.  Gastrointestinal: Positive for abdominal pain. Negative for nausea and vomiting.  Genitourinary: Positive for dysuria and urgency. Negative for hematuria.  Psychiatric/Behavioral: Positive for agitation and dysphoric mood. Negative for self-injury.  Ten systems are reviewed and are negative for acute change except as noted in the HPI     Physical Exam Updated Vital Signs BP 116/85 (BP Location: Right Arm)   Pulse 94   Temp 98.1 F (36.7 C) (Oral)   Resp 24   SpO2 100%   Physical Exam  Constitutional: She is oriented to person, place, and time. She appears well-developed and well-nourished. No distress.  HENT:  Head: Normocephalic and atraumatic.  Nose: Nose normal.  Eyes: Conjunctivae and EOM are normal. Pupils are equal, round, and reactive to light. Right eye exhibits no discharge. Left eye exhibits no discharge. No scleral icterus.  Neck: Normal range of motion. Neck supple.  Cardiovascular: Normal rate and regular rhythm.  Exam reveals no gallop and no friction rub.   No murmur heard.  Pulmonary/Chest: Effort normal and breath sounds normal. No stridor. No respiratory distress. She has no rales.  Abdominal: Soft. She exhibits no distension. There is tenderness in the suprapubic area. There is no rigidity, no rebound and no guarding.  Genitourinary: Uterus is not tender. Cervix exhibits no motion tenderness, no discharge and no friability. Right adnexum displays no mass, no tenderness and no fullness. Left adnexum displays no mass, no tenderness and no fullness. There is bleeding in the vagina. No tenderness in the vagina. No  vaginal discharge found.  Genitourinary Comments: Chaperone present during pelvic exam.   Musculoskeletal: She exhibits no edema or tenderness.  Neurological: She is alert and oriented to person, place, and time.  Skin: Skin is warm and dry. No rash noted. She is not diaphoretic. No erythema.  Psychiatric: She has a normal mood and affect.  Vitals reviewed.    ED Treatments / Results  Labs (all labs ordered are listed, but only abnormal results are displayed) Labs Reviewed  WET PREP, GENITAL - Abnormal; Notable for the following:       Result Value   Trich, Wet Prep PRESENT (*)    Clue Cells Wet Prep HPF POC PRESENT (*)    WBC, Wet Prep HPF POC FEW (*)    All other components within normal limits  URINALYSIS, ROUTINE W REFLEX MICROSCOPIC - Abnormal; Notable for the following:    APPearance HAZY (*)    Ketones, ur 5 (*)    Leukocytes, UA LARGE (*)    Squamous Epithelial / LPF 6-30 (*)    All other components within normal limits  LIPASE, BLOOD  COMPREHENSIVE METABOLIC PANEL  CBC  I-STAT BETA HCG BLOOD, ED ([facility], [facility], [facility] ONLY)  GC/CHLAMYDIA PROBE AMP ([place]) NOT AT [facility]    EKG  EKG Interpretation None       Radiology No results found.  Procedures Procedures (including critical care time)  Medications Ordered in ED Medications  cephALEXin (KEFLEX) capsule 500 mg (not administered)  ondansetron (ZOFRAN-ODT) disintegrating tablet 4 mg (not administered)  metroNIDAZOLE (FLAGYL) tablet 500 mg (not administered)  fentaNYL (SUBLIMAZE) injection 50 mcg (50 mcg Intramuscular Given 02/19/16 2356)     Initial Impression / Assessment and Plan / ED Course  I have reviewed the triage vital signs and the nursing notes.  Pertinent labs & imaging results that were available during my care of the patient were reviewed by me and considered in my medical decision making (see chart for details).  Clinical Course     Workup consistent with urinary tract infection as well as  Trichomonas. Exam without evidence of cervicitis or PID. GC/chlamydia swabs sent.  Behavioral health was consulted who evaluated the patient in the emergency department and felt that she did not meet inpatient criteria. They provided the patient with outpatient resources.  The patient is safe for discharge with strict return precautions.   Final Clinical Impressions(s) / ED Diagnoses   Final diagnoses:  Lower urinary tract infectious disease  Trichomonal infection  Dysuria  Bacterial vaginosis   Disposition: Discharge  Condition: Good  I have discussed the results, Dx and Tx plan with the patient who expressed understanding and agree(s) with the plan. Discharge instructions discussed at great length. The patient was given strict return precautions who verbalized understanding of the instructions. No further questions at time of discharge.    Discharge Medication List as of 02/20/2016 12:40 AM    START taking these medications   Details  cephALEXin (KEFLEX) 500 MG  capsule Take 1 capsule (500 mg total) by mouth 2 (two) times daily., Starting Fri 02/20/2016, Until Wed 02/25/2016, Print    metroNIDAZOLE (FLAGYL) 500 MG tablet Take 1 tablet (500 mg total) by mouth 2 (two) times daily., Starting Fri 02/20/2016, Until Fri 02/27/2016, Print            Nira ConnPedro Eduardo Christpher Stogsdill, MD 02/20/16 [phone]

## 2016-02-20 MED ORDER — METRONIDAZOLE 500 MG PO TABS: 2000.0000 mg | ORAL_TABLET | Freq: Once | ORAL | Status: DC

## 2016-02-20 MED ORDER — METRONIDAZOLE 500 MG PO TABS: 500.0000 mg | ORAL_TABLET | Freq: Two times a day (BID) | ORAL | 0 refills | Status: AC

## 2016-02-20 MED ORDER — CEPHALEXIN 500 MG PO CAPS: 500.0000 mg | ORAL_CAPSULE | Freq: Once | ORAL | Status: DC

## 2016-02-20 MED ORDER — CEPHALEXIN 500 MG PO CAPS
500.0000 mg | ORAL_CAPSULE | Freq: Two times a day (BID) | ORAL | 0 refills | Status: AC
Start: 2016-02-20 — End: 2016-02-25

## 2016-02-20 MED ORDER — METRONIDAZOLE 500 MG PO TABS: 500.0000 mg | ORAL_TABLET | Freq: Once | ORAL | Status: DC

## 2016-02-20 MED ORDER — ONDANSETRON 4 MG PO TBDP: 4.0000 mg | ORAL_TABLET | Freq: Once | ORAL | Status: DC
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
## 2016-02-20 NOTE — ED Notes (Signed)
Pt left room because she was in a rush to go, but was given discharge instructions in the lobby with prescriptions.

## 2016-02-23 LAB — GC/CHLAMYDIA PROBE AMP (~~LOC~~) NOT AT ARMC
Chlamydia: NEGATIVE
Neisseria Gonorrhea: NEGATIVE

## 2016-03-15 DIAGNOSIS — R87612 Low grade squamous intraepithelial lesion on cytologic smear of cervix (LGSIL): Secondary | ICD-10-CM | POA: Insufficient documentation

## 2016-05-21 ENCOUNTER — Emergency Department: Admission: EM | Admit: 2016-05-21 | Discharge: 2016-05-21 | Discharge status: Absent without leave | Payer: Self-pay

## 2016-08-02 ENCOUNTER — Ambulatory Visit: Payer: Self-pay

## 2016-08-16 ENCOUNTER — Ambulatory Visit: Payer: Self-pay | Attending: Oncology

## 2016-11-08 ENCOUNTER — Encounter (INDEPENDENT_AMBULATORY_CARE_PROVIDER_SITE_OTHER): Payer: Self-pay

## 2016-11-08 ENCOUNTER — Ambulatory Visit: Payer: Self-pay | Attending: Oncology | Admitting: *Deleted

## 2016-11-08 ENCOUNTER — Encounter: Payer: Self-pay | Admitting: *Deleted

## 2016-11-08 VITALS — BP 156/95 | HR 104 | Temp 99.4°F | Ht 65.0 in | Wt 165.0 lb

## 2016-11-08 DIAGNOSIS — R87612 Low grade squamous intraepithelial lesion on cytologic smear of cervix (LGSIL): Secondary | ICD-10-CM

## 2016-11-08 NOTE — Progress Notes (Addendum)
Subjective:     Patient ID: Susan Mcknight, female   DOB: [birth year], 31 y.o.   MRN: [id number]  HPI   Review of Systems     Objective:   Physical Exam     Assessment:     31 year old Black female referred to [facility] by [facility] for financial assistance for further evaluation of an abnormal pap smear.  Last pap was at the [facility] on 03/26/16.  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  Patient with a history of abnormal paps of: HPV+ LGSIL 08/29/16, HPV+ LGSIL 11/25/14, HPV+ ASCUS 11/29/13 and HPV + ASCUS on 06/18/09.  Patient is a very poor historian. States she has had abnormal paps for a long time.  States she had a colposcopy and a biopsy in [place].  She does not know where.  At first she states she went to a program like ours at the cancer center in [place].  When we called [facility], they state they could never get her to respond or come in for an appointment in 2016 and 2017.  I did however see that she was seen in [facility] in 2015 and referred to Dr. Loreta Ave.  It looks like a biopsy at that time showed LGSIL and the patient was recommended to have annual pap smears.  I have encouraged her to keep her gyn appointment.  Hand-out on "Understanding Pap Smears" given to patient.    Plan:     Neysa Bonito to schedule patient for gyn consult for colposcopy and possible biopsy.

## 2016-11-09 ENCOUNTER — Other Ambulatory Visit: Payer: Self-pay | Admitting: Obstetrics and Gynecology

## 2016-11-09 ENCOUNTER — Ambulatory Visit (INDEPENDENT_AMBULATORY_CARE_PROVIDER_SITE_OTHER): Payer: PRIVATE HEALTH INSURANCE | Admitting: Obstetrics and Gynecology

## 2016-11-09 ENCOUNTER — Encounter: Payer: Self-pay | Admitting: Obstetrics and Gynecology

## 2016-11-09 VITALS — BP 154/104 | HR 134 | Ht 62.0 in | Wt 163.6 lb

## 2016-11-09 DIAGNOSIS — R87612 Low grade squamous intraepithelial lesion on cytologic smear of cervix (LGSIL): Secondary | ICD-10-CM | POA: Diagnosis not present

## 2016-11-09 NOTE — Progress Notes (Signed)
    GYNECOLOGY CLINIC COLPOSCOPY PROCEDURE NOTE  31 y.o. G0P0000 here for colposcopy for low-grade squamous intraepithelial neoplasia (LGSIL - encompassing HPV,mild dysplasia,CIN I) pap smear on 08/2016. Patient has a history of abnormal pap smears since 2011.  Last several paps have been LGSIL, followed by colposcopy in 2015 (but patient did not f/u for colposcopy in 2016 or 2017). Discussed role for HPV in cervical dysplasia, need for surveillance.  Patient given informed consent, signed copy in the chart, time out was performed.  Placed in lithotomy position. Cervix viewed with speculum and colposcope after application of acetic acid.   Colposcopy adequate? Yes  no mosaicism, no punctation, no abnormal vasculature and acetowhite lesion(s) noted at 2 and 6 o'clock; corresponding biopsies obtained.  ECC specimen obtained. All specimens were labeled and sent to pathology.   Patient was given post procedure instructions.  Will follow up pathology and manage accordingly; patient will be contacted with results and recommendations.  Routine preventative health maintenance measures emphasized.    Hildred Laser, MD [facility]

## 2016-11-11 LAB — PATHOLOGY

## 2016-11-16 ENCOUNTER — Telehealth: Payer: Self-pay

## 2016-11-16 NOTE — Telephone Encounter (Signed)
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  This has not changed since the prior years.  Does not need any further intervention, just monitoring and repeating her pap smear in 1 year.

## 2016-11-16 NOTE — Telephone Encounter (Signed)
Called pt informed her of information below. Pt gave verbal understanding.  

## 2016-11-22 ENCOUNTER — Encounter: Payer: Self-pay | Admitting: *Deleted

## 2016-11-22 NOTE — Progress Notes (Signed)
Letter mailed to inform patient of the need to return for her next pap in one year on 11/09/17 @ 11:30.  HSIS to [place].

## 2017-06-06 LAB — HM HIV SCREENING LAB: HM HIV Screening: NEGATIVE

## 2017-11-09 ENCOUNTER — Ambulatory Visit: Payer: Self-pay | Attending: Oncology

## 2018-01-17 ENCOUNTER — Encounter (HOSPITAL_COMMUNITY): Payer: Self-pay

## 2018-02-03 ENCOUNTER — Other Ambulatory Visit (HOSPITAL_COMMUNITY): Payer: Self-pay | Admitting: *Deleted

## 2018-02-03 ENCOUNTER — Telehealth (HOSPITAL_COMMUNITY): Payer: Self-pay

## 2018-02-03 DIAGNOSIS — N644 Mastodynia: Secondary | ICD-10-CM

## 2018-02-03 NOTE — Telephone Encounter (Signed)
Left message with patient to call back to get scheduled for a [facility] appointment.

## 2018-03-02 ENCOUNTER — Other Ambulatory Visit: Payer: Self-pay

## 2018-03-02 ENCOUNTER — Ambulatory Visit (HOSPITAL_COMMUNITY): Payer: Self-pay

## 2018-03-16 ENCOUNTER — Ambulatory Visit: Payer: Self-pay | Admitting: Obstetrics and Gynecology

## 2018-03-16 ENCOUNTER — Ambulatory Visit: Payer: Self-pay | Admitting: Obstetrics & Gynecology

## 2018-03-30 ENCOUNTER — Ambulatory Visit (HOSPITAL_COMMUNITY)
Admission: RE | Admit: 2018-03-30 | Discharge: 2018-03-30 | Disposition: A | Discharge status: Home / Self Care | Payer: Self-pay | Source: Ambulatory Visit | Attending: Obstetrics and Gynecology | Admitting: Obstetrics and Gynecology

## 2018-03-30 ENCOUNTER — Ambulatory Visit: Payer: Self-pay

## 2018-03-30 ENCOUNTER — Encounter (HOSPITAL_COMMUNITY): Payer: Self-pay

## 2018-03-30 ENCOUNTER — Encounter (HOSPITAL_COMMUNITY): Payer: Self-pay | Admitting: *Deleted

## 2018-03-30 VITALS — BP 124/76 | Wt 165.0 lb

## 2018-03-30 DIAGNOSIS — R87610 Atypical squamous cells of undetermined significance on cytologic smear of cervix (ASC-US): Secondary | ICD-10-CM

## 2018-03-30 DIAGNOSIS — R2232 Localized swelling, mass and lump, left upper limb: Secondary | ICD-10-CM

## 2018-03-30 DIAGNOSIS — R87810 Cervical high risk human papillomavirus (HPV) DNA test positive: Secondary | ICD-10-CM

## 2018-03-30 DIAGNOSIS — N6452 Nipple discharge: Secondary | ICD-10-CM | POA: Insufficient documentation

## 2018-03-30 DIAGNOSIS — Z1239 Encounter for other screening for malignant neoplasm of breast: Secondary | ICD-10-CM

## 2018-03-30 NOTE — Progress Notes (Signed)
[facility] for a colposcopy to follow-up for abnormal Pap smear 01/17/2018.  Pap Smear: Pap smear not completed today. Last [facility] and ASCUS with positive HPV. Ref[facility] to [facility] for a colposcopy to follow-up for her abnormal Pap smear. Appointment scheduled for Monday, April 17, 2018 at 1055. Patient has a history of multiple abnormal Pap smears 03/26/2016 that was LSIL with positive HPV that a colposcopy was completed for follow-up 11/09/2016 that showed CIN-I, 08/29/2015 that was LSIL with positive HPV that no follow-up was completed, 11/29/2013 that was ASCUS with positive HPV that a colposcopy was completed 01/31/2014 that showed CIN-I, 04/10/2013 that was ASCUS with positive HPV that no follow-up was completed, 06/18/2009 that was ASCUS with positive HPV that no follow-up was completed, and in 2010 per patient that a colposcopy was completed for follow-up. All of the above Pap smear and colposcopy results are in Epic, except the the results from 2010.  Physical exam: Breasts Breasts symmetrical. No skin abnormalities bilateral breasts. No nipple retraction bilateral breasts. No nipple discharge right breast. Expressed a scant amount of clear discharge from the left breast. Sample of discharge sent to Cytology for evaluation. No lymphadenopathy. No lumps palpated right breast. Palpated a bb sized lump within the left axillar at 2 o'clock 12 cm from the nipple. Complaints of left axillary tenderness on exam. [facility] for a diagnostic mammogram and left breast ultrasound. Appointment scheduled for Thursday, March 30, 2018 at 1250.        Pelvic/Bimanual No Pap smear completed today since last Pap smear and HPV typing was 01/17/2018. Pap smear not indicated per [facility] guidelines.   Smoking  History: Patient is a former smoker that quit in 2018.  Patient Navigation: Patient education provided. Access to services provided for patient through [facility] program.   Breast and Cervical Cancer Risk Assessment: Patient has a family history of her paternal grandmother and a paternal aunt having breast cancer. Patient has no known genetic mutations or history of radiation treatment to the chest before age 33. Patient has a history of cervical dysplasia. Patient has no history of being immunocompromised or DES exposure in-utero. Breast Cancer risk assessment completed. No breast cancer risk calculated due to patient is less than 33 years old.

## 2018-03-30 NOTE — Patient Instructions (Signed)
Explained breast self awareness with Myrtice Lauth. Patient did not need a Pap smear today due to last Pap smear and HPV typing was 01/17/2018. Discussed the colposcopy the recommended follow-up for her abnormal Pap smear. Referred patient to the [facility] for a colposcopy to follow-up for her abnormal Pap smear. Appointment scheduled for Monday, April 17, 2018 at 1055. Referred patient to the [facility] for a diagnostic mammogram and left breast ultrasound. Appointment scheduled for Thursday, March 30, 2018 at 1250. Patient aware of appointments and will be there. Let patient know will follow up with her within the next couple weeks with results with results of breast discharge by phone. Myrtice Lauth verbalized understanding.  Andru Genter, Kathaleen Maser, RN 12:23 PM

## 2018-04-17 ENCOUNTER — Ambulatory Visit (INDEPENDENT_AMBULATORY_CARE_PROVIDER_SITE_OTHER): Payer: Self-pay | Admitting: Obstetrics & Gynecology

## 2018-04-17 ENCOUNTER — Other Ambulatory Visit (HOSPITAL_COMMUNITY)
Admission: RE | Admit: 2018-04-17 | Discharge: 2018-04-17 | Disposition: A | Discharge status: Home / Self Care | Payer: Self-pay | Source: Ambulatory Visit | Attending: Obstetrics & Gynecology | Admitting: Obstetrics & Gynecology

## 2018-04-17 VITALS — BP 124/78 | HR 94 | Wt 163.5 lb

## 2018-04-17 DIAGNOSIS — N870 Mild cervical dysplasia: Secondary | ICD-10-CM

## 2018-04-17 DIAGNOSIS — R87810 Cervical high risk human papillomavirus (HPV) DNA test positive: Secondary | ICD-10-CM | POA: Insufficient documentation

## 2018-04-17 DIAGNOSIS — R87610 Atypical squamous cells of undetermined significance on cytologic smear of cervix (ASC-US): Secondary | ICD-10-CM | POA: Insufficient documentation

## 2018-04-17 LAB — POCT PREGNANCY, URINE: PREG TEST UR: NEGATIVE

## 2018-04-17 NOTE — Patient Instructions (Addendum)
Colposcopy, Care After  This sheet gives you information about how to care for yourself after your procedure. Your doctor may also give you more specific instructions. If you have problems or questions, contact your doctor.  What can I expect after the procedure?  If you did not have a tissue sample removed (did not have a biopsy), you may only have some spotting for a few days. You can go back to your normal activities.  If you had a tissue sample removed, it is common to have:   Soreness and pain. This may last for a few days.   Light-headedness.   Mild bleeding from your vagina or dark-colored, grainy discharge from your vagina. This may last for a few days. You may need to wear a sanitary pad.   Spotting for at least 48 hours after the procedure.  Follow these instructions at home:     Take over-the-counter and prescription medicines only as told by your doctor. Ask your doctor what medicines you can start taking again. This is very important if you take blood-thinning medicine.   Do not drive or use heavy machinery while taking prescription pain medicine.   For 3 days, or as long as your doctor tells you, avoid:  ? Douching.  ? Using tampons.  ? Having sex.   If you use birth control (contraception), keep using it.   Limit activity for the first day after the procedure. Ask your doctor what activities are safe for you.   It is up to you to get the results of your procedure. Ask your doctor when your results will be ready.   Keep all follow-up visits as told by your doctor. This is important.  Contact a doctor if:   You get a skin rash.  Get help right away if:   You are bleeding a lot from your vagina. It is a lot of bleeding if you are using more than one pad an hour for 2 hours in a row.   You have clumps of blood (blood clots) coming from your vagina.   You have a fever.   You have chills   You have pain in your lower belly (pelvic area).   You have signs of infection, such as vaginal  discharge that is:  ? Different than usual.  ? Yellow.  ? Bad-smelling.   You have very pain or cramps in your lower belly that do not get better with medicine.   You feel light-headed.   You feel dizzy.   You pass out (faint).  Summary   If you did not have a tissue sample removed (did not have a biopsy), you may only have some spotting for a few days. You can go back to your normal activities.   If you had a tissue sample removed, it is common to have mild pain and spotting for 48 hours.   For 3 days, or as long as your doctor tells you, avoid douching, using tampons and having sex.   Get help right away if you have bleeding, very bad pain, or signs of infection.  This information is not intended to replace advice given to you by your health care provider. Make sure you discuss any questions you have with your health care provider.  Document Released: 08/18/2007 Document Revised: 11/19/2015 Document Reviewed: 11/19/2015  Elsevier Interactive Patient Education  2019 [employer]vier Inc[employer]

## 2018-04-17 NOTE — Progress Notes (Signed)
Patient ID: Susan Mcknight, female   DOB: 19-Nov-1985, 33 y.o.   MRN: [id number]  Chief Complaint  Patient presents with  . Colposcopy    HPI Susan Mcknight is a 33 y.o. female.  G0P0000  HPI  Indications: Pap smear on November 2019 showed: ASCUS with POSITIVE high risk HPV. Previous colposcopy: CIN 1 and in 2015. Prior cervical treatment: no treatment.  Past Medical History:  Diagnosis Date  . LGSIL on Pap smear of cervix     Past Surgical History:  Procedure Laterality Date  . COLPOSCOPY  2010    Family History  Problem Relation Age of Onset  . Breast cancer Paternal Aunt   . Diabetes Maternal Grandfather   . Breast cancer Paternal Grandmother     Social History Social History   Tobacco Use  . Smoking status: Former Smoker    Types: Cigarettes    Last attempt to quit: 03/15/2016    Years since quitting: 2.0  . Smokeless tobacco: Never Used  Substance Use Topics  . Alcohol use: Yes  . Drug use: No    No Known Allergies  No current outpatient medications on file.   Current Facility-Administered Medications  Medication Dose Route Frequency Provider Last Rate Last Dose  . ibuprofen (ADVIL,MOTRIN) tablet 800 mg  800 mg Oral Once Fredirick Lathe, MD        Review of Systems Review of Systems  Genitourinary: Negative for menstrual problem, pelvic pain and vaginal discharge.    Blood pressure 124/78, pulse 94, weight 163 lb 8 oz (74.2 kg).  Physical Exam Physical Exam Constitutional:      Appearance: Normal appearance.  Genitourinary:    General: Normal vulva.     Vagina: No vaginal discharge.  Neurological:     Mental Status: She is alert.  Psychiatric:     Comments: Mildly anxious     Data Reviewed Pap and biopsy results  Assessment    Procedure Details  The risks and benefits of the procedure and Written informed consent obtained.  Speculum placed in vagina and excellent visualization of cervix achieved, cervix swabbed x 3 with acetic acid  solution. Nulliparous cervix with minimal AWE, SCJ and TZ seen no endocervical lesion  Specimens: ECC and Bx 1200 and 700  Complications: none.     Plan    Specimens labelled and sent to Pathology. Call to discuss Pathology results in 2 weeks.      Scheryl Darter 04/17/2018, 11:48 AM

## 2018-04-19 ENCOUNTER — Telehealth: Payer: Self-pay

## 2018-04-19 ENCOUNTER — Telehealth: Payer: Self-pay | Admitting: *Deleted

## 2018-04-19 NOTE — Telephone Encounter (Signed)
Called patient and advised her of her Papsmear results and advised to follow up in a year. Patient verbalized understanding and had no questions.

## 2018-04-19 NOTE — Telephone Encounter (Signed)
Susan Mcknight left a voice message she had an appointment Monday and she missed a phone call and wasn't sure what it was about. States best time to call her is after 4; but may leave a message.

## 2018-04-19 NOTE — Telephone Encounter (Signed)
Called pt to advise of Pap results, no answer, left VM.

## 2018-04-19 NOTE — Telephone Encounter (Signed)
-----   Message from Adam Phenix, MD sent at 04/18/2018  4:46 PM EST ----- Low grade result, recommend repeat pap in 12 months

## 2018-04-27 NOTE — Telephone Encounter (Signed)
This has been addressed with [place]. See other encounter from 04/19/18

## 2018-05-23 ENCOUNTER — Telehealth (HOSPITAL_COMMUNITY): Payer: Self-pay | Admitting: *Deleted

## 2018-05-23 NOTE — Telephone Encounter (Signed)
Patient left me a voicemail stating she has received bills for her colposcopy. Patient is a current [facility] patient and the colposcopy is covered by [facility]. Told patient that I will need a copy of the bills sent to me to verify covered by [facility] and will send to the appropriate contact to have corrected. Gave patient fax number to fax bills to me. Patient verbalized understanding.

## 2018-06-30 ENCOUNTER — Emergency Department (HOSPITAL_COMMUNITY): Payer: Self-pay

## 2018-06-30 ENCOUNTER — Other Ambulatory Visit: Payer: Self-pay

## 2018-06-30 ENCOUNTER — Encounter (HOSPITAL_COMMUNITY): Payer: Self-pay | Admitting: Emergency Medicine

## 2018-06-30 ENCOUNTER — Emergency Department (HOSPITAL_COMMUNITY)
Admission: EM | Admit: 2018-06-30 | Discharge: 2018-07-01 | Disposition: A | Discharge status: Home / Self Care | Payer: Self-pay | Attending: Emergency Medicine | Admitting: Emergency Medicine

## 2018-06-30 DIAGNOSIS — S51812A Laceration without foreign body of left forearm, initial encounter: Secondary | ICD-10-CM | POA: Insufficient documentation

## 2018-06-30 DIAGNOSIS — Z87891 Personal history of nicotine dependence: Secondary | ICD-10-CM | POA: Insufficient documentation

## 2018-06-30 DIAGNOSIS — Y939 Activity, unspecified: Secondary | ICD-10-CM | POA: Insufficient documentation

## 2018-06-30 DIAGNOSIS — Y999 Unspecified external cause status: Secondary | ICD-10-CM | POA: Insufficient documentation

## 2018-06-30 DIAGNOSIS — Y929 Unspecified place or not applicable: Secondary | ICD-10-CM | POA: Insufficient documentation

## 2018-06-30 DIAGNOSIS — W25XXXA Contact with sharp glass, initial encounter: Secondary | ICD-10-CM | POA: Insufficient documentation

## 2018-06-30 DIAGNOSIS — Z23 Encounter for immunization: Secondary | ICD-10-CM | POA: Insufficient documentation

## 2018-06-30 MED ORDER — LIDOCAINE-EPINEPHRINE (PF) 2 %-1:200000 IJ SOLN
20.0000 mL | Freq: Once | INTRAMUSCULAR | Status: AC
  Administered 2018-06-30: 20 mL
  Filled 2018-06-30: qty 20

## 2018-06-30 MED ORDER — MORPHINE SULFATE (PF) 4 MG/ML IV SOLN
4.0000 mg | Freq: Once | INTRAVENOUS | Status: AC
  Administered 2018-06-30: 4 mg via INTRAMUSCULAR
  Filled 2018-06-30: qty 1

## 2018-06-30 MED ORDER — HYDROCODONE-ACETAMINOPHEN 5-325 MG PO TABS
1.0000 | ORAL_TABLET | Freq: Once | ORAL | Status: AC
  Administered 2018-06-30: 1 via ORAL
  Filled 2018-06-30: qty 1

## 2018-06-30 MED ORDER — TETANUS-DIPHTH-ACELL PERTUSSIS 5-2.5-18.5 LF-MCG/0.5 IM SUSP
0.5000 mL | Freq: Once | INTRAMUSCULAR | Status: AC
  Administered 2018-06-30: 0.5 mL via INTRAMUSCULAR
  Filled 2018-06-30: qty 0.5

## 2018-06-30 NOTE — Discharge Instructions (Addendum)
You were seen in the emergency department today for a laceration. Your laceration was closed with stitches. Please keep this area clean and dry for the next 24 hours, after 24 hours you may get this area wet, but avoid soaking the area. Keep the area covered as best possible especially when in the sun to help in minimizing scarring.   Your tetanus has been updated.   You will need to have the stitches removed and the wound rechecked in 7-10 days. Please return to the emergency department, go to an urgent care, or see your primary care provider to have this performed. Return to the ER soon should you start to experience pus type drainage from the wound, redness around the wound, or fevers as this could indicate the area is infected, please return to the ER for any other worsening symptoms or concerns that you may have.     Emergency Department Resource Guide 1) Find a Doctor and Pay Out of Pocket Although you won't have to find out who is covered by your insurance plan, it is a good idea to ask around and get recommendations. You will then need to call the office and see if the doctor you have chosen will accept you as a new patient and what types of options they offer for patients who are self-pay. Some doctors offer discounts or will set up payment plans for their patients who do not have insurance, but you will need to ask so you aren't surprised when you get to your appointment.  2) Contact Your Local Health Department Not all health departments have doctors that can see patients for sick visits, but many do, so it is worth a call to see if yours does. If you don't know where your local health department is, you can check in your phone book. The CDC also has a tool to help you locate your state's health department, and many state websites also have listings of all of their local health departments.  3) Find a Walk-in Clinic If your illness is not likely to be very severe or complicated, you may  want to try a walk in clinic. These are popping up all over the country in pharmacies, drugstores, and shopping centers. They're usually staffed by nurse practitioners or physician assistants that have been trained to treat common illnesses and complaints. They're usually fairly quick and inexpensive. However, if you have serious medical issues or chronic medical problems, these are probably not your best option.  No Primary Care Doctor: Call Health Connect at  [phone] - they can help you locate a primary care doctor that  accepts your insurance, provides certain services, etc. Physician Referral Service- [phone]  Chronic Pain Problems: Organization         Address  Phone   Notes  [facility]  [phone] Patients need to be referred by their primary care doctor.   Medication Assistance: Organization         Address  Phone   Notes  [facility] Medication [facility] [address] [place], [place] [address] [phone] --Must be a resident of [facility] -- Must have NO insurance coverage whatsoever (no Medicaid/ Medicare, etc.) -- The pt. MUST have a primary care doctor that directs their care regularly and follows them in the community   [facility]  [phone]   [employer]  [phone]    Agencies that provide inexpensive medical care: Organization  Address  Phone   Notes  [facility]  [phone]) [phone]   [facility]    [phone]) [phone]   [facility] [address] [place], [place] [address] [phone]   [facility] [address] [place], [place] [phone]) [phone]   [facility]    [phone]   [facility]    [phone]   [facility]  [address], [place] Phone:  [phone], Fax:  [phone]) [phone] Hours of Operation:  9 am - 6 pm, M-F.  Also accepts Medicaid/Medicare and self-pay.   [facility]  [address], [place] Phone: [phone], Fax: [phone]) [phone]. Hours of Operation:  8:30 am - 5:30 pm, M-F.  Also accepts Medicaid and self-pay.  [facility] [place] [address], [place] Phone: [phone]) [phone]   [facility] [address], [place], [place] [phone], Ext. [phone] Mondays & Thursdays: 7-9 AM.  First 15 patients are seen on a first come, first serve basis.    Medicaid-accepting [facility] Providers:  Organization         Address  Phone   Notes  [facility] [address], [place] [phone] Also accepts self-pay patients.  [facility] [address], [place]  [phone]) [phone]   [facility] [address], [place] [phone]   [facility] [address] [place] [address], [place] [phone]   [facility] [address], [place]   [phone] Only accepts [place] patients after they have their name applied to their card.   Self-Pay (no insurance) in [facility]:  Organization         Address  Phone   Notes  Sickle Cell Patients, [facility] [address], [place] [phone]   [facility] [address], [place] [phone]   [facility] [place]  [address], [place] [phone]) [phone]   [facility]/Dr. Osei-Bonsu  [address] [place] [address], [place] or [id number], [place] [phone]) [phone] Phone number for both [place] and [place] locations is the same.  [facility] [address], [place] [phone]   [facility] [facility] [address] [place] [address], [place] or [address] [phone]   [facility] [address], [place] [phone], phone; [phone])  [phone], fax Sees patients 1st and 3rd Saturday of every month.  Must not qualify for public or private insurance (i.e. Medicaid, Medicare, [facility] Choice, Veterans' Benefits)  Household income should be no more than 200% of the poverty level The clinic cannot treat you if you are pregnant or think you are pregnant  Sexually transmitted diseases are not treated at the clinic.

## 2018-06-30 NOTE — ED Notes (Addendum)
Patient is agitated, as she is screaming at staff asking "why is it taking so fucking long to sew me up?" Patient continues to scream at this RN. This patient is given pain medicine. She once again starts screaming asking "WHEN AM I GETTING SEWED UP?" Lead RN explains to patient that her X-ray results is not back yet. PT states "I'M LEAVING, I'M GOING TO [place], IT SHOULDN'T TAKE THAT LONG TO SEW ME UP,  I'M CALLING A CAB." Patient walked out of triage room

## 2018-06-30 NOTE — ED Triage Notes (Signed)
Patient [facility] home via [facility] with laceration to left forearm from hitting her arm on a door. Per EMS, its about 3 inches deep, wrapped by EMS on arrival On arrival, patient is stable, ambulatory
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
## 2018-09-20 DIAGNOSIS — Z62810 Personal history of physical and sexual abuse in childhood: Secondary | ICD-10-CM

## 2018-09-20 DIAGNOSIS — Z8619 Personal history of other infectious and parasitic diseases: Secondary | ICD-10-CM

## 2018-09-20 DIAGNOSIS — F3181 Bipolar II disorder: Secondary | ICD-10-CM

## 2018-09-21 ENCOUNTER — Other Ambulatory Visit: Payer: Self-pay

## 2018-09-21 ENCOUNTER — Other Ambulatory Visit: Payer: Self-pay | Admitting: Family Medicine

## 2018-09-21 ENCOUNTER — Ambulatory Visit: Payer: Self-pay | Admitting: Nurse Practitioner

## 2018-09-21 DIAGNOSIS — B9689 Other specified bacterial agents as the cause of diseases classified elsewhere: Secondary | ICD-10-CM

## 2018-09-21 DIAGNOSIS — N760 Acute vaginitis: Secondary | ICD-10-CM

## 2018-09-21 DIAGNOSIS — Z113 Encounter for screening for infections with a predominantly sexual mode of transmission: Secondary | ICD-10-CM

## 2018-09-21 LAB — WET PREP FOR TRICH, YEAST, CLUE
Trichomonas Exam: NEGATIVE
Yeast Exam: NEGATIVE
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
## 2018-09-22 DIAGNOSIS — B9689 Other specified bacterial agents as the cause of diseases classified elsewhere: Secondary | ICD-10-CM | POA: Insufficient documentation

## 2018-09-22 DIAGNOSIS — N760 Acute vaginitis: Secondary | ICD-10-CM | POA: Insufficient documentation

## 2018-09-22 MED ORDER — METRONIDAZOLE 500 MG PO TABS: 500.0000 mg | ORAL_TABLET | Freq: Two times a day (BID) | ORAL | 0 refills | Status: AC

## 2018-09-22 NOTE — Progress Notes (Signed)
On 09/21/2018 wet mount reviewed and pt treated for BV per Dr. Joelene Millin Newton's standing orders. Provider orders completed.

## 2018-09-22 NOTE — Addendum Note (Signed)
Addended by: Doy Mince on: 09/22/2018 08:49 AM   Modules accepted: Orders

## 2018-09-26 LAB — GONOCOCCUS CULTURE

## 2018-10-05 NOTE — Addendum Note (Signed)
Addended by: Caryl Bis on: 10/05/2018 09:41 AM   Modules accepted: Orders

## 2018-10-09 NOTE — Addendum Note (Signed)
Addended by: Jerline Pain on: 10/09/2018 05:07 PM   Modules accepted: Orders

## 2018-10-09 NOTE — Progress Notes (Signed)
Syphilis test ordered.

## 2018-11-16 ENCOUNTER — Encounter: Payer: Self-pay | Admitting: Family Medicine

## 2018-11-16 ENCOUNTER — Ambulatory Visit (LOCAL_COMMUNITY_HEALTH_CENTER): Payer: Self-pay | Admitting: Family Medicine

## 2018-11-16 ENCOUNTER — Other Ambulatory Visit: Payer: Self-pay

## 2018-11-16 VITALS — BP 126/88 | Ht 63.5 in | Wt 165.8 lb

## 2018-11-16 DIAGNOSIS — Z3009 Encounter for other general counseling and advice on contraception: Secondary | ICD-10-CM

## 2018-11-16 DIAGNOSIS — R87612 Low grade squamous intraepithelial lesion on cytologic smear of cervix (LGSIL): Secondary | ICD-10-CM

## 2018-11-16 DIAGNOSIS — Z30013 Encounter for initial prescription of injectable contraceptive: Secondary | ICD-10-CM

## 2018-11-16 MED ORDER — MEDROXYPROGESTERONE ACETATE 150 MG/ML IM SUSP
150.0000 mg | INTRAMUSCULAR | Status: DC
  Administered 2018-11-16 – 2019-02-01 (×2): 150 mg via INTRAMUSCULAR

## 2018-11-16 NOTE — Progress Notes (Signed)
Pt states she received her last depo in June 2020 (probably the middle of June) with provider in [place]. Last physical with [facility] was 10/12/2016.

## 2018-11-16 NOTE — Progress Notes (Signed)
DMPA given. Provider orders completed.
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
## 2019-02-01 ENCOUNTER — Other Ambulatory Visit: Payer: Self-pay

## 2019-02-01 ENCOUNTER — Encounter: Payer: Self-pay | Admitting: Family Medicine

## 2019-02-01 ENCOUNTER — Ambulatory Visit (LOCAL_COMMUNITY_HEALTH_CENTER): Payer: Self-pay | Admitting: Family Medicine

## 2019-02-01 VITALS — BP 153/77 | Ht 64.0 in | Wt 172.0 lb

## 2019-02-01 DIAGNOSIS — R87612 Low grade squamous intraepithelial lesion on cytologic smear of cervix (LGSIL): Secondary | ICD-10-CM

## 2019-02-01 DIAGNOSIS — Z30013 Encounter for initial prescription of injectable contraceptive: Secondary | ICD-10-CM

## 2019-02-01 DIAGNOSIS — Z113 Encounter for screening for infections with a predominantly sexual mode of transmission: Secondary | ICD-10-CM

## 2019-02-01 DIAGNOSIS — Z3009 Encounter for other general counseling and advice on contraception: Secondary | ICD-10-CM

## 2019-02-01 LAB — WET PREP FOR TRICH, YEAST, CLUE
Trichomonas Exam: NEGATIVE
Yeast Exam: NEGATIVE

## 2019-02-01 MED ORDER — METRONIDAZOLE 0.75 % VA GEL: 1.0000 | Freq: Every day | VAGINAL | 0 refills | Status: AC
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
## 2019-02-01 NOTE — Patient Instructions (Signed)
Culturelle Apple Cider baths

## 2019-02-01 NOTE — Progress Notes (Signed)
Wet mount reviewed with provider and pt received Metrogel 0.75% per Lauretta Chester, MD verbal order. Pt also received Depo 150mg  IM today per Lauretta Chester, MD verbal order and order from 11/16/2018 visit. Pt tolerated well. Provider orders completed.Ronny Bacon, RN

## 2019-02-01 NOTE — Progress Notes (Signed)
Here for Depo and std screening. Last Depo 11 weeks ago. Denies sx's.Aileen Fass, RN

## 2019-02-13 LAB — IGP, APTIMA HPV
HPV Aptima: NEGATIVE
PAP Smear Comment: 0

## 2019-04-23 ENCOUNTER — Ambulatory Visit (LOCAL_COMMUNITY_HEALTH_CENTER): Payer: Self-pay | Admitting: Family Medicine

## 2019-04-23 ENCOUNTER — Other Ambulatory Visit: Payer: Self-pay

## 2019-04-23 ENCOUNTER — Encounter: Payer: Self-pay | Admitting: Family Medicine

## 2019-04-23 VITALS — BP 131/81 | Ht 63.0 in | Wt 175.6 lb

## 2019-04-23 DIAGNOSIS — Z113 Encounter for screening for infections with a predominantly sexual mode of transmission: Secondary | ICD-10-CM

## 2019-04-23 DIAGNOSIS — Z30013 Encounter for initial prescription of injectable contraceptive: Secondary | ICD-10-CM

## 2019-04-23 DIAGNOSIS — F419 Anxiety disorder, unspecified: Secondary | ICD-10-CM

## 2019-04-23 DIAGNOSIS — F329 Major depressive disorder, single episode, unspecified: Secondary | ICD-10-CM

## 2019-04-23 DIAGNOSIS — Z3042 Encounter for surveillance of injectable contraceptive: Secondary | ICD-10-CM

## 2019-04-23 LAB — WET PREP FOR TRICH, YEAST, CLUE
Trichomonas Exam: NEGATIVE
Yeast Exam: NEGATIVE

## 2019-04-23 MED ORDER — THERA VITAL M PO TABS: 1.0000 | ORAL_TABLET | Freq: Every day | ORAL | 0 refills | Status: DC

## 2019-04-23 MED ORDER — MEDROXYPROGESTERONE ACETATE 150 MG/ML IM SUSP
150.0000 mg | INTRAMUSCULAR | Status: AC
  Administered 2019-04-23 – 2020-03-27 (×3): 150 mg via INTRAMUSCULAR

## 2019-04-23 NOTE — Progress Notes (Signed)
Patient here for Depo at 11 4/7 from last Depo. Order for 1 year Depo written by Dr. Alvester Morin on 11/16/2019. Next Pap due 01/2020. Would like AMariana Kaufman card today. Also wants STD testing today.Burt Knack, RN
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
## 2019-05-01 ENCOUNTER — Ambulatory Visit: Payer: Self-pay

## 2019-05-01 ENCOUNTER — Telehealth: Payer: Self-pay

## 2019-05-01 ENCOUNTER — Other Ambulatory Visit: Payer: Self-pay

## 2019-05-01 DIAGNOSIS — A549 Gonococcal infection, unspecified: Secondary | ICD-10-CM

## 2019-05-01 MED ORDER — CEFTRIAXONE SODIUM 250 MG IJ SOLR
500.0000 mg | Freq: Once | INTRAMUSCULAR | Status: AC
  Administered 2019-05-01: 15:00:00 500 mg via INTRAMUSCULAR

## 2019-05-01 NOTE — Telephone Encounter (Signed)
TC to patient. Verified ID via password/SS#. Informed of positive GC and need for tx. Instructed to eat before visit and have partner call for tx appt. Appt scheduled. Dameian Crisman, RN   

## 2019-05-15 ENCOUNTER — Other Ambulatory Visit: Payer: Self-pay

## 2019-05-15 ENCOUNTER — Ambulatory Visit: Payer: Self-pay | Admitting: Licensed Clinical Social Worker

## 2019-05-15 DIAGNOSIS — F320 Major depressive disorder, single episode, mild: Secondary | ICD-10-CM

## 2019-05-15 DIAGNOSIS — F419 Anxiety disorder, unspecified: Secondary | ICD-10-CM

## 2019-05-15 NOTE — Progress Notes (Signed)
Counselor/Therapist Progress Note  Patient ID: Susan Mcknight, MRN: [id number],    Date: 05/15/2019  Time Spent: 25 minutes   Treatment Type: Discussed treatment options   Reported Symptoms: racing thoughts, difficulties concentrating, anxiousness, depressed mood  Mental Status Exam:  Appearance:   Casual     Behavior:  Sharing  Motor:  Normal  Speech/Language:   Normal Rate  Affect:  Appropriate  Mood:  anxious and sad  Thought process:  normal  Thought content:    WNL  Sensory/Perceptual disturbances:    WNL  Orientation:  oriented to person, place, time/date, situation and day of week  Attention:  Good  Concentration:  Good  Memory:  WNL  Fund of knowledge:   Good  Insight:    Good  Judgment:   Good  Impulse Control:  Good    Subjective: Patient was engaged and cooperative throughout the session using time effectively to discuss symptoms, needs, and treatment options. Patient voices a desire to be evaluated for medication management due to chronic symptoms and difficulties in multiple life domains. Patient voiced agreement with plan for her to follow up with [facility] - she voiced plans to go there now.   Interventions: LCSW engaged patient in discussing treatment options, provided information about [facility] and [facility]. Provided support and encouraged patient to reach out, if needed.   Diagnosis:   ICD-10-CM   1. Anxiety  F41.9   2. Current mild episode of major depressive disorder, unspecified whether recurrent (HCC)  F32.0     Plan: Patient plan's to follow up with [facility] due to need for higher level of care and to be evaluated for psychiatric medication management.   Interpreter used: NA   No future appointments.    Kathreen Cosier, LCSW
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
## 2019-07-17 ENCOUNTER — Ambulatory Visit: Payer: Self-pay

## 2019-07-27 ENCOUNTER — Encounter: Payer: Self-pay | Admitting: Family Medicine

## 2019-07-27 ENCOUNTER — Other Ambulatory Visit: Payer: Self-pay

## 2019-07-27 ENCOUNTER — Ambulatory Visit (LOCAL_COMMUNITY_HEALTH_CENTER): Payer: Self-pay | Admitting: Family Medicine

## 2019-07-27 VITALS — BP 136/84 | Ht 62.0 in | Wt 174.2 lb

## 2019-07-27 DIAGNOSIS — Z3009 Encounter for other general counseling and advice on contraception: Secondary | ICD-10-CM

## 2019-07-27 DIAGNOSIS — Z113 Encounter for screening for infections with a predominantly sexual mode of transmission: Secondary | ICD-10-CM

## 2019-07-27 LAB — WET PREP FOR TRICH, YEAST, CLUE
Trichomonas Exam: NEGATIVE
Yeast Exam: NEGATIVE

## 2019-07-27 MED ORDER — MEDROXYPROGESTERONE ACETATE 150 MG/ML IM SUSP
150.0000 mg | Freq: Once | INTRAMUSCULAR | Status: AC
  Administered 2019-07-27: 150 mg via INTRAMUSCULAR
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
## 2019-07-27 NOTE — Progress Notes (Signed)
In @ 13 4/7 weeks after last Depo-desires to continue; desires STD screening tests today Sharlette Dense, RN Wet prep reviewed-no Tx indicated Sharlette Dense, RN

## 2019-07-31 LAB — GONOCOCCUS CULTURE

## 2019-09-07 ENCOUNTER — Emergency Department: Payer: Self-pay

## 2019-09-07 ENCOUNTER — Emergency Department
Admission: EM | Admit: 2019-09-07 | Discharge: 2019-09-07 | Disposition: A | Discharge status: Home / Self Care | Payer: Self-pay | Attending: Emergency Medicine | Admitting: Emergency Medicine

## 2019-09-07 ENCOUNTER — Other Ambulatory Visit: Payer: Self-pay

## 2019-09-07 DIAGNOSIS — B349 Viral infection, unspecified: Secondary | ICD-10-CM | POA: Insufficient documentation

## 2019-09-07 DIAGNOSIS — Z87891 Personal history of nicotine dependence: Secondary | ICD-10-CM | POA: Insufficient documentation

## 2019-09-07 DIAGNOSIS — Z20822 Contact with and (suspected) exposure to covid-19: Secondary | ICD-10-CM | POA: Insufficient documentation

## 2019-09-07 DIAGNOSIS — J069 Acute upper respiratory infection, unspecified: Secondary | ICD-10-CM | POA: Insufficient documentation

## 2019-09-07 MED ORDER — GUAIFENESIN-CODEINE 100-10 MG/5ML PO SYRP: 5.0000 mL | ORAL_SOLUTION | Freq: Three times a day (TID) | ORAL | 0 refills | Status: AC | PRN
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
## 2019-09-07 NOTE — ED Triage Notes (Signed)
Pt arrive via POV for reports of a "persistent cough" that causes her to vomit at times x 4 days accompanied with nasal congestion. Denies fever. Pt states she has tried delsym and nyquil without relief. Pt in NAD, speaking in complete sentences without shob, skin warm and dry.

## 2019-09-07 NOTE — ED Notes (Signed)
See triage note   Presents with dry cough,persistent cough  States she started coughing 4 days ago  No fever

## 2019-09-08 LAB — SARS CORONAVIRUS 2 (TAT 6-24 HRS): SARS Coronavirus 2: NEGATIVE

## 2019-10-09 ENCOUNTER — Ambulatory Visit (LOCAL_COMMUNITY_HEALTH_CENTER): Payer: Self-pay | Admitting: Advanced Practice Midwife

## 2019-10-09 ENCOUNTER — Encounter: Payer: Self-pay | Admitting: Advanced Practice Midwife

## 2019-10-09 ENCOUNTER — Other Ambulatory Visit: Payer: Self-pay

## 2019-10-09 VITALS — BP 120/78 | Ht 63.0 in | Wt 178.0 lb

## 2019-10-09 DIAGNOSIS — E669 Obesity, unspecified: Secondary | ICD-10-CM | POA: Insufficient documentation

## 2019-10-09 DIAGNOSIS — Z62810 Personal history of physical and sexual abuse in childhood: Secondary | ICD-10-CM

## 2019-10-09 DIAGNOSIS — Z3009 Encounter for other general counseling and advice on contraception: Secondary | ICD-10-CM

## 2019-10-09 DIAGNOSIS — F3181 Bipolar II disorder: Secondary | ICD-10-CM

## 2019-10-09 DIAGNOSIS — Z30013 Encounter for initial prescription of injectable contraceptive: Secondary | ICD-10-CM

## 2019-10-09 DIAGNOSIS — F1290 Cannabis use, unspecified, uncomplicated: Secondary | ICD-10-CM | POA: Insufficient documentation

## 2019-10-09 DIAGNOSIS — Z113 Encounter for screening for infections with a predominantly sexual mode of transmission: Secondary | ICD-10-CM

## 2019-10-09 LAB — WET PREP FOR TRICH, YEAST, CLUE
Trichomonas Exam: NEGATIVE
Yeast Exam: NEGATIVE

## 2019-10-09 MED ORDER — METRONIDAZOLE 500 MG PO TABS: 500.0000 mg | ORAL_TABLET | Freq: Two times a day (BID) | ORAL | 0 refills | Status: AC

## 2019-10-09 MED ORDER — MULTIVITAMINS PO CAPS: 1.0000 | ORAL_CAPSULE | Freq: Every day | ORAL | 0 refills | Status: DC

## 2019-10-09 MED ORDER — MEDROXYPROGESTERONE ACETATE 150 MG/ML IM SUSP: 150.0000 mg | Freq: Once | INTRAMUSCULAR | Status: DC

## 2019-10-09 NOTE — Progress Notes (Signed)
In for Depo. Last Depo 07/27/19. Requests screening for STD. Given condoms, multivitamin. Treated for BV per standing order. Sharlyne Pacas, RN
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
## 2019-10-14 LAB — GONOCOCCUS CULTURE

## 2019-11-26 ENCOUNTER — Ambulatory Visit: Payer: Self-pay | Admitting: Physician Assistant

## 2019-11-26 ENCOUNTER — Other Ambulatory Visit: Payer: Self-pay

## 2019-11-26 DIAGNOSIS — Z113 Encounter for screening for infections with a predominantly sexual mode of transmission: Secondary | ICD-10-CM

## 2019-11-26 LAB — WET PREP FOR TRICH, YEAST, CLUE
Trichomonas Exam: NEGATIVE
Yeast Exam: NEGATIVE

## 2019-11-28 ENCOUNTER — Encounter: Payer: Self-pay | Admitting: Physician Assistant
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
## 2019-12-01 LAB — GONOCOCCUS CULTURE

## 2019-12-19 ENCOUNTER — Ambulatory Visit: Payer: Self-pay | Admitting: Family Medicine

## 2019-12-19 ENCOUNTER — Other Ambulatory Visit: Payer: Self-pay

## 2019-12-19 ENCOUNTER — Encounter: Payer: Self-pay | Admitting: Family Medicine

## 2019-12-19 DIAGNOSIS — Z113 Encounter for screening for infections with a predominantly sexual mode of transmission: Secondary | ICD-10-CM

## 2019-12-19 LAB — WET PREP FOR TRICH, YEAST, CLUE
Trichomonas Exam: NEGATIVE
Yeast Exam: NEGATIVE
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
## 2019-12-19 NOTE — Progress Notes (Signed)
Per wet prep no treatment needed.  Patient waiting on other lab results.  Patient informed will be call if any abnormal test results.  PCP list given and patient encouraged to establish care at PCP.  Patient has no questions at this time.  Patient to call if any questions or problems.  Wendi Snipes, RN

## 2019-12-24 LAB — GONOCOCCUS CULTURE

## 2020-01-02 ENCOUNTER — Encounter: Payer: Self-pay | Admitting: Gerontology

## 2020-01-02 ENCOUNTER — Other Ambulatory Visit: Payer: Self-pay

## 2020-01-02 ENCOUNTER — Ambulatory Visit: Payer: Self-pay | Admitting: Gerontology

## 2020-01-02 VITALS — BP 128/81 | HR 99 | Resp 16 | Ht 63.0 in | Wt 180.0 lb

## 2020-01-02 DIAGNOSIS — R030 Elevated blood-pressure reading, without diagnosis of hypertension: Secondary | ICD-10-CM

## 2020-01-02 DIAGNOSIS — K029 Dental caries, unspecified: Secondary | ICD-10-CM | POA: Insufficient documentation

## 2020-01-02 DIAGNOSIS — Z7689 Persons encountering health services in other specified circumstances: Secondary | ICD-10-CM | POA: Insufficient documentation

## 2020-01-02 DIAGNOSIS — Z973 Presence of spectacles and contact lenses: Secondary | ICD-10-CM | POA: Insufficient documentation

## 2020-01-02 DIAGNOSIS — F419 Anxiety disorder, unspecified: Secondary | ICD-10-CM | POA: Insufficient documentation

## 2020-01-02 NOTE — Progress Notes (Signed)
Patient ID: Susan Mcknight, female   DOB: 1985/04/27, 33 y.o.   MRN: [id number]  Chief Complaint  Patient presents with  . Establish Care    HPI Susan Mcknight is a 34 y.o. female. She presents today to establish care at this clinic and to review her medical problems. She denies chest pain, shortness of breath, chills, fatigue, or any pain. She reports a history of anxiety, nearsightedness, and dental caries. Her LMP was 11/02/19 and she uses the depo provera injection for birth control. She states that she takes no medications daily, but does take a multivitamin. She states that she manages her anxiety by smoking marijuana daily. She denies suicidal or homicidal ideation. She states that she would like to see a mental health provider. She also reports that she has a cavity at the top of her L front tooth. Denies pain or bleeding from site. She is also requesting information on getting new glasses. She states that her last eye exam and glasses prescription was over 2 years ago. Her initial blood pressure reading was elevated at 145/92, but was 128/81 when rechecked after sitting down for some time. Overall, she states that she is doing well and offers no further complaint.    Past Medical History:  Diagnosis Date  . LGSIL on Pap smear of cervix     Past Surgical History:  Procedure Laterality Date  . COLPOSCOPY  2010    Family History  Problem Relation Age of Onset  . Breast cancer Paternal Aunt   . Diabetes Maternal Grandfather   . Hypertension Maternal Grandfather   . Stroke Maternal Grandfather   . Ulcers Maternal Grandfather   . Breast cancer Paternal Grandmother   . Depression Mother   . Lung cancer Paternal Grandfather   . Bipolar disorder Half-Sister   . Thyroid disease Half-Sister   . Migraines Half-Brother     Social History Social History   Tobacco Use  . Smoking status: Former Smoker    Types: Cigarettes    Quit date: 03/15/2016    Years since quitting: 3.8  .  Smokeless tobacco: Never Used  Vaping Use  . Vaping Use: Never used  Substance Use Topics  . Alcohol use: Yes    Comment: 2x/mo glass of wine  . Drug use: Yes    Frequency: 7.0 times per week    Types: Marijuana    Comment: Reports at least 1 joint/week, up to 0.25 grams/week    No Known Allergies  Current Outpatient Medications  Medication Sig Dispense Refill  . Multiple Vitamin (MULTIVITAMIN) capsule Take 1 capsule by mouth daily. 100 capsule 0  . Multiple Vitamins-Minerals (MULTIVITAMIN) tablet Take 1 tablet by mouth daily. 100 tablet 0   Current Facility-Administered Medications  Medication Dose Route Frequency Provider Last Rate Last Admin  . ibuprofen (ADVIL,MOTRIN) tablet 800 mg  800 mg Oral Once Nila Nephew, MD      . medroxyPROGESTERone (DEPO-PROVERA) injection 150 mg  150 mg Intramuscular Q90 days Staples, Jenna L, PA-C   150 mg at 10/09/19 1137  . medroxyPROGESTERone (DEPO-PROVERA) injection 150 mg  150 mg Intramuscular Once Herbie Saxon, CNM        Review of Systems Review of Systems  Constitutional: Negative.   HENT: Positive for dental problem (cavity above L front tooth).   Respiratory: Negative.   Cardiovascular: Negative.   Gastrointestinal: Negative.   Endocrine: Negative.   Genitourinary: Negative.   Musculoskeletal: Negative.   Skin: Negative.   Allergic/Immunologic: Negative.  Neurological: Negative.   Psychiatric/Behavioral: Negative for suicidal ideas. The patient is nervous/anxious.     Blood pressure 128/81, pulse 99, resp. rate 16, height '5\' 3"'  (1.6 m), weight 180 lb (81.6 kg), last menstrual period 11/02/2019, SpO2 98 %.  Physical Exam Physical Exam Constitutional:      Appearance: Normal appearance.  HENT:     Head: Normocephalic.  Eyes:     Pupils: Pupils are equal, round, and reactive to light.  Cardiovascular:     Rate and Rhythm: Normal rate and regular rhythm.     Heart sounds: Normal heart sounds. No murmur heard.    Pulmonary:     Effort: Pulmonary effort is normal.  Chest:     Chest wall: No tenderness.  Abdominal:     General: Abdomen is flat. There is no distension.     Palpations: Abdomen is soft.     Tenderness: There is no abdominal tenderness.  Musculoskeletal:        General: Normal range of motion.  Skin:    General: Skin is dry.     Capillary Refill: Capillary refill takes less than 2 seconds.  Neurological:     General: No focal deficit present.     Mental Status: She is alert and oriented to person, place, and time.  Psychiatric:        Mood and Affect: Mood normal.        Behavior: Behavior normal.        Thought Content: Thought content normal.        Judgment: Judgment normal.    Data Reviewed Previous chart data reviewed.   Assessment & Plan 1. Dental caries Dental application provided.   2. Wears glasses [facility] application provided for ophthalmology. - Ambulatory referral to Ophthalmology  3. Anxiety Appointment scheduled with Jerrilyn Cairo for mental health evaluation.  4. Encounter to establish care Baseline labs drawn today. Advised to abstain from smoking marijuana. - CBC w/Diff; Future - Comp Met (CMET); Future - HgB A1c; Future - Lipid Profile; Future - TSH; Future - Urinalysis - TSH - Lipid Profile - HgB A1c - Comp Met (CMET) - CBC w/Diff  Return in about 4 weeks (around 01/30/2020), or if symptoms worsen or fail to improve.  Jake Camry Theiss 01/02/2020, 1:05 PM

## 2020-01-03 LAB — CBC WITH DIFFERENTIAL/PLATELET
Basophils Absolute: 0.1 10*3/uL (ref 0.0–0.2)
Basos: 1 %
EOS (ABSOLUTE): 0.2 10*3/uL (ref 0.0–0.4)
Eos: 1 %
Hematocrit: 40.8 % (ref 34.0–46.6)
Hemoglobin: 14 g/dL (ref 11.1–15.9)
Immature Grans (Abs): 0 10*3/uL (ref 0.0–0.1)
Immature Granulocytes: 0 %
Lymphocytes Absolute: 3.8 10*3/uL — ABNORMAL HIGH (ref 0.7–3.1)
Lymphs: 34 %
MCH: 32.1 pg (ref 26.6–33.0)
MCHC: 34.3 g/dL (ref 31.5–35.7)
MCV: 94 fL (ref 79–97)
Monocytes Absolute: 0.7 10*3/uL (ref 0.1–0.9)
Monocytes: 6 %
Neutrophils Absolute: 6.6 10*3/uL (ref 1.4–7.0)
Neutrophils: 58 %
Platelets: 331 10*3/uL (ref 150–450)
RBC: 4.36 x10E6/uL (ref 3.77–5.28)
RDW: 13.1 % (ref 11.7–15.4)
WBC: 11.4 10*3/uL — ABNORMAL HIGH (ref 3.4–10.8)

## 2020-01-03 LAB — LIPID PANEL
Chol/HDL Ratio: 3.1 ratio (ref 0.0–4.4)
Cholesterol, Total: 169 mg/dL (ref 100–199)
HDL: 55 mg/dL (ref >39)
LDL Chol Calc (NIH): 86 mg/dL (ref 0–99)
Triglycerides: 167 mg/dL — ABNORMAL HIGH (ref 0–149)
VLDL Cholesterol Cal: 28 mg/dL (ref 5–40)

## 2020-01-03 LAB — COMPREHENSIVE METABOLIC PANEL
ALT: 24 IU/L (ref 0–32)
AST: 20 IU/L (ref 0–40)
Albumin/Globulin Ratio: 1.8 (ref 1.2–2.2)
Albumin: 4.9 g/dL — ABNORMAL HIGH (ref 3.8–4.8)
Alkaline Phosphatase: 63 IU/L (ref 44–121)
BUN/Creatinine Ratio: 8 — ABNORMAL LOW (ref 9–23)
BUN: 7 mg/dL (ref 6–20)
Bilirubin Total: <0.2 mg/dL (ref 0.0–1.2)
CO2: 21 mmol/L (ref 20–29)
Calcium: 10.2 mg/dL (ref 8.7–10.2)
Chloride: 103 mmol/L (ref 96–106)
Creatinine, Ser: 0.88 mg/dL (ref 0.57–1.00)
GFR calc Af Amer: 100 mL/min/{1.73_m2} (ref >59)
GFR calc non Af Amer: 87 mL/min/{1.73_m2} (ref >59)
Globulin, Total: 2.7 g/dL (ref 1.5–4.5)
Glucose: 89 mg/dL (ref 65–99)
Potassium: 4.2 mmol/L (ref 3.5–5.2)
Sodium: 139 mmol/L (ref 134–144)
Total Protein: 7.6 g/dL (ref 6.0–8.5)

## 2020-01-03 LAB — URINALYSIS
Bilirubin, UA: NEGATIVE
Glucose, UA: NEGATIVE
Ketones, UA: NEGATIVE
Leukocytes,UA: NEGATIVE
Nitrite, UA: NEGATIVE
Protein,UA: NEGATIVE
RBC, UA: NEGATIVE
Specific Gravity, UA: <=1.005 — AB (ref 1.005–1.030)
Urobilinogen, Ur: 0.2 mg/dL (ref 0.2–1.0)
pH, UA: 6 (ref 5.0–7.5)

## 2020-01-03 LAB — TSH: TSH: 0.524 u[IU]/mL (ref 0.450–4.500)

## 2020-01-03 LAB — HEMOGLOBIN A1C
Est. average glucose Bld gHb Est-mCnc: 117 mg/dL
Hgb A1c MFr Bld: 5.7 % — ABNORMAL HIGH (ref 4.8–5.6)

## 2020-01-09 ENCOUNTER — Telehealth: Payer: Self-pay | Admitting: Licensed Clinical Social Worker

## 2020-01-09 NOTE — Telephone Encounter (Signed)
Called patient twice to inquire as to if she knows if the appointment she has for November 10th with Rhett Bannister is for a behavioral health mental assessment and if she does know this, inform her that social work intern needs to reschedule this appointment.  Patient's phone rang and automated message saying, "your call cannot be completed at this time".

## 2020-01-10 NOTE — Telephone Encounter (Signed)
Called patient and rescheduled her appointment with her to see [facility].

## 2020-01-23 ENCOUNTER — Institutional Professional Consult (permissible substitution): Payer: Self-pay | Admitting: Licensed Clinical Social Worker

## 2020-01-31 ENCOUNTER — Other Ambulatory Visit: Payer: Self-pay

## 2020-01-31 ENCOUNTER — Ambulatory Visit: Payer: Self-pay | Admitting: Gerontology

## 2020-01-31 ENCOUNTER — Encounter: Payer: Self-pay | Admitting: Gerontology

## 2020-01-31 VITALS — BP 113/78 | HR 99 | Wt 176.8 lb

## 2020-01-31 DIAGNOSIS — F419 Anxiety disorder, unspecified: Secondary | ICD-10-CM

## 2020-01-31 DIAGNOSIS — G8929 Other chronic pain: Secondary | ICD-10-CM

## 2020-01-31 DIAGNOSIS — M25561 Pain in right knee: Secondary | ICD-10-CM | POA: Insufficient documentation

## 2020-01-31 DIAGNOSIS — R7303 Prediabetes: Secondary | ICD-10-CM | POA: Insufficient documentation

## 2020-01-31 DIAGNOSIS — K029 Dental caries, unspecified: Secondary | ICD-10-CM

## 2020-01-31 DIAGNOSIS — Z0000 Encounter for general adult medical examination without abnormal findings: Secondary | ICD-10-CM

## 2020-01-31 NOTE — Progress Notes (Signed)
Established Patient Office Visit  Subjective:  Patient ID: Susan Mcknight, female    DOB: 28-Nov-1985  Age: 34 y.o. MRN: [id number]  CC:  Chief Complaint  Patient presents with  . Knee Injury    taking tylenol or advil when needed. normal throb and swells when standing on it for too long  . Anxiety    desires medication to control fidgeting and sweaty hands ; was on Ritalin when she was in elementary     HPI Susan Mcknight is a 34 y.o female who presents for follow up of prediabetes and lab review. Her  HgbA1C done 01/02/2020 was 5.7%. Her triglycerides was 167 mg/dL, and her urine specific gravity was < 1.005. She complained of increased anxiety, sweaty palms and racing thoughts. She verbalized being on Ritalin since 4th grade. She verbalized not being able to focus or keep her job due to increased anxiety. She denies suicidal or homicidal ideations. She complained of intermittent discomfort to her right knee with occasional swelling. Currently she denies pain, swelling or discomfort to her right knee. She also complained of needing to see a dentist to check up on her teeth. She denies chest pain, dizziness, shortness of breath or dyspnea on exertion. Overall, she state she is doing well and offer no further complaint.     Past Medical History:  Diagnosis Date  . LGSIL on Pap smear of cervix     Past Surgical History:  Procedure Laterality Date  . COLPOSCOPY  2010    Family History  Problem Relation Age of Onset  . Breast cancer Paternal Aunt   . Diabetes Maternal Grandfather   . Hypertension Maternal Grandfather   . Stroke Maternal Grandfather   . Ulcers Maternal Grandfather   . Breast cancer Paternal Grandmother   . Depression Mother   . Lung cancer Paternal Grandfather   . Bipolar disorder Half-Sister   . Thyroid disease Half-Sister   . Migraines Half-Brother     Social History   Socioeconomic History  . Marital status: Single    Spouse name: Not on file  .  Number of children: 0  . Years of education: Not on file  . Highest education level: Some college, no degree  Occupational History  . Not on file  Tobacco Use  . Smoking status: Former Smoker    Types: Cigarettes    Quit date: 03/15/2016    Years since quitting: 3.8  . Smokeless tobacco: Never Used  Vaping Use  . Vaping Use: Never used  Substance and Sexual Activity  . Alcohol use: Yes    Comment: 2x/mo glass of wine  . Drug use: Yes    Frequency: 21.0 times per week    Types: Marijuana    Comment: Reports at least  21 joints/week, up to 0.25 grams/week  . Sexual activity: Yes    Partners: Male    Birth control/protection: Injection, Condom  Other Topics Concern  . Not on file  Social History Narrative   ** Merged History Encounter **       Social Determinants of Health   Financial Resource Strain:   . Difficulty of Paying Living Expenses: Not on file  Food Insecurity:   . Worried About Programme researcher, broadcasting/film/video in the Last Year: Not on file  . Ran Out of Food in the Last Year: Not on file  Transportation Needs:   . Lack of Transportation (Medical): Not on file  . Lack of Transportation (Non-Medical): Not on file  Physical  Activity:   . Days of Exercise per Week: Not on file  . Minutes of Exercise per Session: Not on file  Stress:   . Feeling of Stress : Not on file  Social Connections:   . Frequency of Communication with Friends and Family: Not on file  . Frequency of Social Gatherings with Friends and Family: Not on file  . Attends Religious Services: Not on file  . Active Member of Clubs or Organizations: Not on file  . Attends Banker Meetings: Not on file  . Marital Status: Not on file  Intimate Partner Violence:   . Fear of Current or Ex-Partner: Not on file  . Emotionally Abused: Not on file  . Physically Abused: Not on file  . Sexually Abused: Not on file    Outpatient Medications Prior to Visit  Medication Sig Dispense Refill  . Multiple  Vitamin (MULTIVITAMIN) capsule Take 1 capsule by mouth daily. 100 capsule 0  . Multiple Vitamins-Minerals (MULTIVITAMIN) tablet Take 1 tablet by mouth daily. (Patient not taking: Reported on 01/31/2020) 100 tablet 0   Facility-Administered Medications Prior to Visit  Medication Dose Route Frequency Provider Last Rate Last Admin  . ibuprofen (ADVIL,MOTRIN) tablet 800 mg  800 mg Oral Once Fredirick Lathe, MD      . medroxyPROGESTERone (DEPO-PROVERA) injection 150 mg  150 mg Intramuscular Q90 days Staples, Jenna L, PA-C   150 mg at 10/09/19 1137  . medroxyPROGESTERone (DEPO-PROVERA) injection 150 mg  150 mg Intramuscular Once Sciora, Elizabeth A, CNM        No Known Allergies  ROS Review of Systems  Constitutional: Negative.   HENT: Negative.   Eyes: Negative.   Respiratory: Negative.   Cardiovascular: Negative.   Gastrointestinal: Negative.   Endocrine: Negative.   Genitourinary: Negative.   Skin: Negative.   Neurological: Negative.   Hematological: Negative.   Psychiatric/Behavioral: Negative.       Objective:    Physical Exam Constitutional:      Appearance: Normal appearance.  HENT:     Head: Normocephalic and atraumatic.  Cardiovascular:     Rate and Rhythm: Normal rate and regular rhythm.     Pulses: Normal pulses.     Heart sounds: Normal heart sounds.  Pulmonary:     Effort: Pulmonary effort is normal.     Breath sounds: Normal breath sounds.  Musculoskeletal:        General: Normal range of motion.  Neurological:     General: No focal deficit present.     Mental Status: She is alert.  Psychiatric:        Mood and Affect: Mood normal.        Behavior: Behavior normal.        Thought Content: Thought content normal.        Judgment: Judgment normal.     BP 113/78 (BP Location: Right Arm, Patient Position: Sitting, Cuff Size: Large)   Pulse 99   Wt 176 lb 12.8 oz (80.2 kg)   SpO2 98%   BMI 31.32 kg/m  Wt Readings from Last 3 Encounters:  01/31/20 176  lb 12.8 oz (80.2 kg)  01/02/20 180 lb (81.6 kg)  10/09/19 178 lb (80.7 kg)   She was encouraged to continue with her current weight loss regimen   Health Maintenance Due  Topic Date Due  . Hepatitis C Screening  Never done  . COVID-19 Vaccine (1) Never done  . INFLUENZA VACCINE  Never done  . PAP SMEAR-Modifier  02/01/2020  There are no preventive care reminders to display for this patient.  Lab Results  Component Value Date   TSH 0.524 01/02/2020   Lab Results  Component Value Date   WBC 11.4 (H) 01/02/2020   HGB 14.0 01/02/2020   HCT 40.8 01/02/2020   MCV 94 01/02/2020   PLT 331 01/02/2020   Lab Results  Component Value Date   NA 139 01/02/2020   K 4.2 01/02/2020   CO2 21 01/02/2020   GLUCOSE 89 01/02/2020   BUN 7 01/02/2020   CREATININE 0.88 01/02/2020   BILITOT <0.2 01/02/2020   ALKPHOS 63 01/02/2020   AST 20 01/02/2020   ALT 24 01/02/2020   PROT 7.6 01/02/2020   ALBUMIN 4.9 (H) 01/02/2020   CALCIUM 10.2 01/02/2020   ANIONGAP 10 02/19/2016   Lab Results  Component Value Date   CHOL 169 01/02/2020   Lab Results  Component Value Date   HDL 55 01/02/2020   Lab Results  Component Value Date   LDLCALC 86 01/02/2020   Lab Results  Component Value Date   TRIG 167 (H) 01/02/2020   Lab Results  Component Value Date   CHOLHDL 3.1 01/02/2020   Lab Results  Component Value Date   HGBA1C 5.7 (H) 01/02/2020      Assessment & Plan:   1. Anxiety She will continue to follow up with Rhett Bannister at the clinic for mental health. She was advised to call the crisis hotline with worsening symptoms   2. Prediabetes Her HgbA1C was 5.7%. She was advised decrease her intake of concentrated sweets and exercise as tolerated  She was advised to increase her water intake  3. Health care maintenance She was provided with [facility] flyer and would schedule for a Pap smear exam - Ambulatory referral to Hematology / Oncology  4. Chronic pain of right knee She  complained of chronic right knee pain. No discomfort noted with palpation, flexion, or extension of the right knee. She was advised to apply warm compress and elevate knee when in a sitting or lying position  5. Dental caries She was provided with dental application and advised to complete and return to clinic     Follow-up:  On 05/30/2019 or sooner if symptoms worsen or fail to improve     Onnie Graham, RN

## 2020-01-31 NOTE — Patient Instructions (Signed)
Chronic Knee Pain, Adult Knee pain that lasts longer than 3 months is called chronic knee pain. You may have pain in one or both knees. Symptoms of chronic knee pain may also include swelling and stiffness. The most common cause is age-related wear and tear (osteoarthritis) of your knee joint. Many conditions can cause chronic knee pain. Treatment depends on the cause. The main treatments are physical therapy and weight loss. It may also be treated with medicines, injections, a knee sleeve or brace, and by using crutches. Rest, ice, compression (pressure), and elevation (RICE) therapy may also be recommended. Follow these instructions at home: If you have a knee sleeve or brace:   Wear it as told by your doctor. Remove it only as told by your doctor.  Loosen it if your toes: ? Tingle. ? Become numb. ? Turn cold and blue.  Keep it clean.  If the sleeve or brace is not waterproof: ? Do not let it get wet. ? Remove it if told by your doctor, or cover it with a watertight covering when you take a bath or shower. Managing pain, stiffness, and swelling      If told, put heat on your knee. Do this as often as told by your doctor. Use the heat source that your doctor recommends, such as a moist heat pack or a heating pad. ? If you have a removable sleeve or brace, remove it as told by your doctor. ? Place a towel between your skin and the heat source. ? Leave the heat on for 20-30 minutes. ? Remove the heat if your skin turns bright red. This is very important if you are unable to feel pain, heat, or cold. You may have a greater risk of getting burned.  If told, put ice on your knee. ? If you have a removable sleeve or brace, remove it as told by your doctor. ? Put ice in a plastic bag. ? Place a towel between your skin and the bag. ? Leave the ice on for 20 minutes, 2-3 times a day.  Move your toes often.  Raise (elevate) the injured area above the level of your heart while you are  sitting or lying down. Activity  Avoid activities where both feet leave the ground at the same time (high-impact activities). Examples are running, jumping rope, and doing jumping jacks.  Return to your normal activities as told by your doctor. Ask your doctor what activities are safe for you.  Follow the exercise plan that your doctor makes for you. Your doctor may suggest that you: ? Avoid activities that make knee pain worse. You may need to change the exercises that you do, the sports that you participate in, or your job duties. ? Wear shoes with cushioned soles. ? Avoid high-impact activities or sports that require running and sudden changes in direction. ? Do exercises or physical therapy as told by your doctor. Physical therapy is planned to match your needs and abilities. ? Do exercises that increase your balance and strength, such as tai chi and yoga.  Do not use your injured knee to support your body weight until your doctor says that you can. Use crutches, a cane, or a walker, as told by your doctor. General instructions  Take over-the-counter and prescription medicines only as told by your doctor.  If you are overweight, work with your doctor and a food expert (dietitian) to set goals to lose weight. Being overweight can make your knee hurt more.  Do   not use any products that contain nicotine or tobacco, such as cigarettes, e-cigarettes, and chewing tobacco. If you need help quitting, ask your doctor.  Keep all follow-up visits as told by your doctor. This is important. Contact a doctor if:  You have knee pain that is not getting better or gets worse.  You are not able to do your exercises due to knee pain. Get help right away if:  Your knee swells and the swelling becomes worse.  You cannot move your knee.  You have very bad knee pain. Summary  Knee pain that lasts more than 3 months is considered chronic knee pain.  The main treatments for chronic knee pain are  physical therapy and weight loss. You may also need to take medicines, wear a knee sleeve or brace, use crutches, and put ice or heat on your knee.  Lose weight if you are overweight. Work with your doctor and a food expert (dietitian) to help you set goals to lose weight. Being overweight can make your knee hurt more.  Work with a physical therapist to make a safe exercise program, as told by your doctor. This information is not intended to replace advice given to you by your health care provider. Make sure you discuss any questions you have with your health care provider. Document Revised: 05/11/2018 Document Reviewed: 05/11/2018 Elsevier Patient Education  2020 [employer]  Prediabetes Eating Plan Prediabetes is a condition that causes blood sugar (glucose) levels to be higher than normal. This increases the risk for developing diabetes. In order to prevent diabetes from developing, your health care provider may recommend a diet and other lifestyle changes to help you:  Control your blood glucose levels.  Improve your cholesterol levels.  Manage your blood pressure. Your health care provider may recommend working with a diet and nutrition specialist (dietitian) to make a meal plan that is best for you. What are tips for following this plan? Lifestyle  Set weight loss goals with the help of your health care team. It is recommended that most people with prediabetes lose 7% of their current body weight.  Exercise for at least 30 minutes at least 5 days a week.  Attend a support group or seek ongoing support from a mental health counselor.  Take over-the-counter and prescription medicines only as told by your health care provider. Reading food labels  Read food labels to check the amount of fat, salt (sodium), and sugar in prepackaged foods. Avoid foods that have: ? Saturated fats. ? Trans fats. ? Added sugars.  Avoid foods that have more than 300 milligrams (mg) of sodium per  serving. Limit your daily sodium intake to less than 2,300 mg each day. Shopping  Avoid buying pre-made and processed foods. Cooking  Cook with olive oil. Do not use butter, lard, or ghee.  Bake, broil, grill, or boil foods. Avoid frying. Meal planning   Work with your dietitian to develop an eating plan that is right for you. This may include: ? Tracking how many calories you take in. Use a food diary, notebook, or mobile application to track what you eat at each meal. ? Using the glycemic index (GI) to plan your meals. The index tells you how quickly a food will raise your blood glucose. Choose low-GI foods. These foods take a longer time to raise blood glucose.  Consider following a Mediterranean diet. This diet includes: ? Several servings each day of fresh fruits and vegetables. ? Eating fish at least twice a  week. ? Several servings each day of whole grains, beans, nuts, and seeds. ? Using olive oil instead of other fats. ? Moderate alcohol consumption. ? Eating small amounts of red meat and whole-fat dairy.  If you have high blood pressure, you may need to limit your sodium intake or follow a diet such as the DASH eating plan. DASH is an eating plan that aims to lower high blood pressure. What foods are recommended? The items listed below may not be a complete list. Talk with your dietitian about what dietary choices are best for you. Grains Whole grains, such as whole-wheat or whole-grain breads, crackers, cereals, and pasta. Unsweetened oatmeal. Bulgur. Barley. Quinoa. Brown rice. Corn or whole-wheat flour tortillas or taco shells. Vegetables Lettuce. Spinach. Peas. Beets. Cauliflower. Cabbage. Broccoli. Carrots. Tomatoes. Squash. Eggplant. Herbs. Peppers. Onions. Cucumbers. Brussels sprouts. Fruits Berries. Bananas. Apples. Oranges. Grapes. Papaya. Mango. Pomegranate. Kiwi. Grapefruit. Cherries. Meats and other protein foods Seafood. Poultry without skin. Lean cuts of pork  and beef. Tofu. Eggs. Nuts. Beans. Dairy Low-fat or fat-free dairy products, such as yogurt, cottage cheese, and cheese. Beverages Water. Tea. Coffee. Sugar-free or diet soda. Seltzer water. Lowfat or no-fat milk. Milk alternatives, such as soy or almond milk. Fats and oils Olive oil. Canola oil. Sunflower oil. Grapeseed oil. Avocado. Walnuts. Sweets and desserts Sugar-free or low-fat pudding. Sugar-free or low-fat ice cream and other frozen treats. Seasoning and other foods Herbs. Sodium-free spices. Mustard. Relish. Low-fat, low-sugar ketchup. Low-fat, low-sugar barbecue sauce. Low-fat or fat-free mayonnaise. What foods are not recommended? The items listed below may not be a complete list. Talk with your dietitian about what dietary choices are best for you. Grains Refined white flour and flour products, such as bread, pasta, snack foods, and cereals. Vegetables Canned vegetables. Frozen vegetables with butter or cream sauce. Fruits Fruits canned with syrup. Meats and other protein foods Fatty cuts of meat. Poultry with skin. Breaded or fried meat. Processed meats. Dairy Full-fat yogurt, cheese, or milk. Beverages Sweetened drinks, such as sweet iced tea and soda. Fats and oils Butter. Lard. Ghee. Sweets and desserts Baked goods, such as cake, cupcakes, pastries, cookies, and cheesecake. Seasoning and other foods Spice mixes with added salt. Ketchup. Barbecue sauce. Mayonnaise. Summary  To prevent diabetes from developing, you may need to make diet and other lifestyle changes to help control blood sugar, improve cholesterol levels, and manage your blood pressure.  Set weight loss goals with the help of your health care team. It is recommended that most people with prediabetes lose 7 percent of their current body weight.  Consider following a Mediterranean diet that includes plenty of fresh fruits and vegetables, whole grains, beans, nuts, seeds, fish, lean meat, low-fat dairy,  and healthy oils. This information is not intended to replace advice given to you by your health care provider. Make sure you discuss any questions you have with your health care provider. Document Revised: 06/23/2018 Document Reviewed: 05/05/2016 Elsevier Patient Education  2020 [employer].

## 2020-01-31 NOTE — Progress Notes (Unsigned)
PAP Letter mailed today.  Repeat PAP and Physical due 01/2020.  Venancio Chenier, RN  

## 2020-02-06 ENCOUNTER — Telehealth: Payer: Self-pay

## 2020-02-06 NOTE — Telephone Encounter (Signed)
Patient turned in dental referral application and $20 copay on 01/03/2020 and once more on 02/05/2020, receiving two orange cards each time (107/108 & 117/118) for a total of four cards.  Per policy, patient only eligible for a one time dental referral in which they are given two orange cards to present to dental office. [facility] covers the cost of two dental visits.  Any services still needed past the two visits require the patient to make arrangements with the dental office.   Called to make patient aware that we would honor the four orange cards since the volunteers/providers did not catch she had already received a referral previously, however she would not be eligible for further dental referrals through [facility]. Patient expressed understanding.

## 2020-02-12 ENCOUNTER — Ambulatory Visit: Payer: Self-pay | Admitting: Licensed Clinical Social Worker

## 2020-02-12 ENCOUNTER — Other Ambulatory Visit: Payer: Self-pay

## 2020-02-12 DIAGNOSIS — F419 Anxiety disorder, unspecified: Secondary | ICD-10-CM
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
## 2020-02-13 ENCOUNTER — Telehealth: Payer: Self-pay

## 2020-02-13 NOTE — Telephone Encounter (Signed)
Telephone call to patient today regarding the need for a repeat PAP and physical due after 02/01/2020.  Phone states "not accepting calls at this time".  MyChart message sent to patient today. Hart Carwin, RN

## 2020-02-15 NOTE — Telephone Encounter (Signed)
Telephone call to patient today.  Phone states "the caller is not accepting calls at this time". Hart Carwin, RN

## 2020-02-19 ENCOUNTER — Other Ambulatory Visit: Payer: Self-pay | Admitting: Gerontology

## 2020-02-19 DIAGNOSIS — F3181 Bipolar II disorder: Secondary | ICD-10-CM

## 2020-02-19 MED ORDER — CARBAMAZEPINE 200 MG PO TABS: 200.0000 mg | ORAL_TABLET | Freq: Two times a day (BID) | ORAL | 0 refills | Status: DC

## 2020-02-26 ENCOUNTER — Ambulatory Visit: Payer: Self-pay | Admitting: Licensed Clinical Social Worker

## 2020-02-28 ENCOUNTER — Telehealth: Payer: Self-pay | Admitting: Gerontology

## 2020-02-28 NOTE — Telephone Encounter (Signed)
Received call from patient stating that she will discontinue receiving care from the [facility] because she wants to seek care from a facility that will be able to prescribe medication for mental health.

## 2020-03-27 ENCOUNTER — Other Ambulatory Visit: Payer: Self-pay

## 2020-03-27 ENCOUNTER — Ambulatory Visit: Payer: Self-pay | Admitting: Gerontology

## 2020-03-27 ENCOUNTER — Ambulatory Visit (LOCAL_COMMUNITY_HEALTH_CENTER): Payer: Self-pay | Admitting: Family Medicine

## 2020-03-27 ENCOUNTER — Encounter: Payer: Self-pay | Admitting: Family Medicine

## 2020-03-27 VITALS — BP 134/84 | Wt 172.4 lb

## 2020-03-27 DIAGNOSIS — Z113 Encounter for screening for infections with a predominantly sexual mode of transmission: Secondary | ICD-10-CM

## 2020-03-27 DIAGNOSIS — Z3009 Encounter for other general counseling and advice on contraception: Secondary | ICD-10-CM

## 2020-03-27 DIAGNOSIS — Z3042 Encounter for surveillance of injectable contraceptive: Secondary | ICD-10-CM

## 2020-03-27 LAB — WET PREP FOR TRICH, YEAST, CLUE
Trichomonas Exam: NEGATIVE
Yeast Exam: NEGATIVE

## 2020-03-27 LAB — PREGNANCY, URINE: Preg Test, Ur: NEGATIVE

## 2020-03-27 NOTE — Progress Notes (Signed)
[facility] problem visit  [facility]  Subjective:  Susan Mcknight is a 35 y.o. being seen today for   Chief Complaint  Patient presents with  . Contraception    depo  . SEXUALLY TRANSMITTED DISEASE    Screening     HPI   Does the patient have a current or past history of drug use? Yes   No components found for: HCV]   Health Maintenance Due  Topic Date Due  . Hepatitis C Screening  Never done  . COVID-19 Vaccine (1) Never done  . INFLUENZA VACCINE  Never done  . PAP SMEAR-Modifier  02/01/2020    ROS  The following portions of the patient's history were reviewed and updated as appropriate: allergies, current medications, past family history, past medical history, past social history, past surgical history and problem list. Problem list updated.   See flowsheet for other program required questions.  Objective:   Vitals:   03/27/20 0907  BP: 134/84  Weight: 172 lb 6.4 oz (78.2 kg)    Physical Exam Vitals and nursing note reviewed.  Constitutional:      Appearance: Normal appearance.  HENT:     Head: Normocephalic and atraumatic.     Mouth/Throat:     Mouth: Mucous membranes are moist.     Pharynx: Oropharynx is clear. No oropharyngeal exudate or posterior oropharyngeal erythema.  Pulmonary:     Effort: Pulmonary effort is normal.  Chest:  Breasts:     Right: No axillary adenopathy or supraclavicular adenopathy.     Left: No axillary adenopathy or supraclavicular adenopathy.    Abdominal:     General: Abdomen is flat.     Palpations: There is no mass.     Tenderness: There is no abdominal tenderness. There is no rebound.  Genitourinary:    Exam position: Lithotomy position.     Pubic Area: No rash or pubic lice.      Labia:        Right: No rash or lesion.        Left: No rash or lesion.      Vagina: No erythema, bleeding or lesions.     Cervix: No cervical motion tenderness, discharge, friability, lesion  or erythema.     Uterus: Normal.      Adnexa: Right adnexa normal and left adnexa normal.     Comments: Exam deferred, patient declined pelvic and self collected  Musculoskeletal:     Cervical back: Normal range of motion and neck supple.  Lymphadenopathy:     Head:     Right side of head: No preauricular or posterior auricular adenopathy.     Left side of head: No preauricular or posterior auricular adenopathy.     Cervical: No cervical adenopathy.     Upper Body:     Right upper body: No supraclavicular or axillary adenopathy.     Left upper body: No supraclavicular or axillary adenopathy.     Lower Body: No right inguinal adenopathy. No left inguinal adenopathy.  Skin:    General: Skin is warm and dry.     Findings: No rash.  Neurological:     Mental Status: She is alert and oriented to person, place, and time.  Psychiatric:        Mood and Affect: Mood normal.        Behavior: Behavior normal.       Assessment and Plan:  Susan Mcknight is a 35 y.o. female presenting  to [facility] for a Women's Health problem visit  1. Surveillance for Depo-Provera contraception Last depo given 10/09/2019.  Patient is 24 weeks and 2 days.  Discussed with patient the importance of keeping depo with in 3 months for best effectiveness.    Pregnancy test today if negative, ok to give depo per 04/23/19 order .  Patient to retake PT at home in 1 week.     Patient is due next depo on 06/12/20.  Discussed with patient that next PE is due 04/2020 and Pap was due 11/21 all can be completed at next depo appointment.  No more depo's will be given without physical.    2. Screening examination for venereal disease  Denies and s/sx and reports that was using lavender scented toilet paper that caused some irritation "a little while back"  Patient reports that she has discontinued using that type of toilet paper   Patient accepted all screenings including oral, vaginal CT/GC and  bloodwork for HIV/RPR/ HBV//HCV  Patient meets criteria for HepB screening? Yes. Ordered? Yes Patient meets criteria for HepC screening? Yes. Ordered? Yes  Wet prep results negative  NO Treatment needed  Discussed time line for State Lab results and that patient will be called with positive results and encouraged patient to call if she had not heard in 2 weeks.  Counseled to return or seek care for continued or worsening symptoms Recommended condom use with all sex  Patient is currently using depo to prevent pregnancy.       Return in about 3 months (around 06/25/2020) for depo.  Future Appointments  Date Time Provider Department Center  04/02/2020  9:30 AM Rolm Gala, NP [facility] None  04/09/2020  9:00 AM Pruitt, Benard Halsted, Student-Social Work [facility] None    Wendi Snipes, FNP

## 2020-03-27 NOTE — Progress Notes (Signed)
Patient here for depo and std screening. Last depo 07/2019. Physical in 12/19/2019.   Harvie Heck, RN   Post:  RN reviewed wet mount and pregnancy test with patient. No tx per S.O. RN admin depo 150 mg IM per Elveria Rising FNP orders.   Harvie Heck, RN

## 2020-04-01 LAB — GONOCOCCUS CULTURE

## 2020-04-02 ENCOUNTER — Other Ambulatory Visit: Payer: Self-pay

## 2020-04-02 ENCOUNTER — Telehealth: Payer: Self-pay | Admitting: Gerontology

## 2020-04-04 ENCOUNTER — Encounter: Payer: Self-pay | Admitting: Student

## 2020-04-04 LAB — HM HIV SCREENING LAB: HM HIV Screening: NEGATIVE

## 2020-04-04 LAB — HM HEPATITIS C SCREENING LAB: HM Hepatitis Screen: NEGATIVE

## 2020-04-09 ENCOUNTER — Ambulatory Visit: Payer: Self-pay | Admitting: Licensed Clinical Social Worker

## 2020-04-09 ENCOUNTER — Ambulatory Visit: Payer: Self-pay | Admitting: Gerontology

## 2020-04-09 ENCOUNTER — Other Ambulatory Visit: Payer: Self-pay

## 2020-04-09 DIAGNOSIS — F3181 Bipolar II disorder: Secondary | ICD-10-CM

## 2020-04-09 DIAGNOSIS — F419 Anxiety disorder, unspecified: Secondary | ICD-10-CM

## 2020-04-09 NOTE — Progress Notes (Signed)
Established Patient Office Visit  Subjective:  Patient ID: Susan Mcknight, female    DOB: 18-Dec-1985  Age: 35 y.o. MRN: [id number]  CC: No chief complaint on file. Patient consents to telephone visit and 2 patient identifiers was used to identify patient.  HPI Susan Mcknight presents for follow up Anxiety. She states that her mood is good, denies suicidal nor homicidal ideation. She denies chest pain, palpitation, and light headedness. She states that she will reschedule her Pap smear. Overall, she states that she's doing well and offers no further complaint.  Past Medical History:  Diagnosis Date  . LGSIL on Pap smear of cervix     Past Surgical History:  Procedure Laterality Date  . COLPOSCOPY  2010    Family History  Problem Relation Age of Onset  . Breast cancer Paternal Aunt   . Diabetes Maternal Grandfather   . Hypertension Maternal Grandfather   . Stroke Maternal Grandfather   . Ulcers Maternal Grandfather   . Breast cancer Paternal Grandmother   . Depression Mother   . Lung cancer Paternal Grandfather   . Bipolar disorder Half-Sister   . Thyroid disease Half-Sister   . Migraines Half-Brother     Social History   Socioeconomic History  . Marital status: Single    Spouse name: Not on file  . Number of children: 0  . Years of education: Not on file  . Highest education level: Some college, no degree  Occupational History  . Not on file  Tobacco Use  . Smoking status: Former Smoker    Types: Cigarettes    Quit date: 03/15/2016    Years since quitting: 4.0  . Smokeless tobacco: Never Used  Vaping Use  . Vaping Use: Never used  Substance and Sexual Activity  . Alcohol use: Yes    Comment: 2x/mo glass of wine  . Drug use: Yes    Frequency: 21.0 times per week    Types: Marijuana    Comment: Reports at least  21 joints/week, up to 0.25 grams/week  . Sexual activity: Yes    Partners: Male    Birth control/protection: Injection, Condom  Other Topics  Concern  . Not on file  Social History Narrative   ** Merged History Encounter **       Social Determinants of Health   Financial Resource Strain: Not on file  Food Insecurity: Not on file  Transportation Needs: Not on file  Physical Activity: Not on file  Stress: Not on file  Social Connections: Not on file  Intimate Partner Violence: Not At Risk  . Fear of Current or Ex-Partner: No  . Emotionally Abused: No  . Physically Abused: No  . Sexually Abused: No    Outpatient Medications Prior to Visit  Medication Sig Dispense Refill  . carbamazepine (TEGRETOL) 200 MG tablet Take 1 tablet (200 mg total) by mouth 2 (two) times daily. (Patient not taking: No sig reported) 60 tablet 0  . Multiple Vitamin (MULTIVITAMIN) capsule Take 1 capsule by mouth daily. (Patient not taking: No sig reported) 100 capsule 0  . Multiple Vitamins-Minerals (MULTIVITAMIN) tablet Take 1 tablet by mouth daily. (Patient not taking: No sig reported) 100 tablet 0   Facility-Administered Medications Prior to Visit  Medication Dose Route Frequency Provider Last Rate Last Admin  . medroxyPROGESTERone (DEPO-PROVERA) injection 150 mg  150 mg Intramuscular Q90 days Staples, Jenna L, PA-C   150 mg at 03/27/20 1052  . medroxyPROGESTERone (DEPO-PROVERA) injection 150 mg  150 mg Intramuscular Once  Sciora, Elizabeth A, CNM      . ibuprofen (ADVIL,MOTRIN) tablet 800 mg  800 mg Oral Once Fredirick Lathe, MD        No Known Allergies  ROS Review of Systems  Constitutional: Negative.   Respiratory: Negative.   Cardiovascular: Negative.   Neurological: Negative.   Psychiatric/Behavioral: Negative.       Objective:    Physical Exam No physical exam was done There were no vitals taken for this visit. Wt Readings from Last 3 Encounters:  03/27/20 172 lb 6.4 oz (78.2 kg)  01/31/20 176 lb 12.8 oz (80.2 kg)  01/02/20 180 lb (81.6 kg)     Health Maintenance Due  Topic Date Due  . COVID-19 Vaccine (1) Never done   . PAP SMEAR-Modifier  02/01/2020    There are no preventive care reminders to display for this patient.  Lab Results  Component Value Date   TSH 0.524 01/02/2020   Lab Results  Component Value Date   WBC 11.4 (H) 01/02/2020   HGB 14.0 01/02/2020   HCT 40.8 01/02/2020   MCV 94 01/02/2020   PLT 331 01/02/2020   Lab Results  Component Value Date   NA 139 01/02/2020   K 4.2 01/02/2020   CO2 21 01/02/2020   GLUCOSE 89 01/02/2020   BUN 7 01/02/2020   CREATININE 0.88 01/02/2020   BILITOT <0.2 01/02/2020   ALKPHOS 63 01/02/2020   AST 20 01/02/2020   ALT 24 01/02/2020   PROT 7.6 01/02/2020   ALBUMIN 4.9 (H) 01/02/2020   CALCIUM 10.2 01/02/2020   ANIONGAP 10 02/19/2016   Lab Results  Component Value Date   CHOL 169 01/02/2020   Lab Results  Component Value Date   HDL 55 01/02/2020   Lab Results  Component Value Date   LDLCALC 86 01/02/2020   Lab Results  Component Value Date   TRIG 167 (H) 01/02/2020   Lab Results  Component Value Date   CHOLHDL 3.1 01/02/2020   Lab Results  Component Value Date   HGBA1C 5.7 (H) 01/02/2020      Assessment & Plan:    1. Anxiety - She states that her mood is good, and will follow up with [facility]. She was advised to call the Crisis help line or go to the ED for worsening symptoms.    Follow-up: Return in about 13 weeks (around 07/09/2020), or if symptoms worsen or fail to improve.    Hakan Nudelman Trellis Paganini, NP

## 2020-04-09 NOTE — BH Specialist Note (Signed)
Integrated Behavioral Health Follow Up In-Person Visit  MRN: [id number] Name: Blakeley Scheier   Total time: 60 minutes  Types of Service: General Behavioral Integrated Care (BHI)  Interpretor:No. Interpretor Name and Language:   Subjective: Ariam Mol is a 35 y.o. female accompanied by herself Patient was referred by Hurman Horn, NP  for mental health Patient reports the following symptoms/concerns: The patient reports that things are about the same since her last visit. She notes that she wants medication for her mental health. She asked which medications [facility] could not prescribe. She stated that she would go to [facility] walk-in for care. Duration of problem: ; Severity of problem: moderate  Objective: Mood: Euthymic and Affect: Appropriate Risk of harm to self or others: No plan to harm self or others  Life Context: Family and Social: see above School/Work: see above Self-Care: see above Life Changes: see above  Patient and/or Family's Strengths/Protective Factors: Concrete supports in place (healthy food, safe environments, etc.)  Goals Addressed: Patient will: 1.  Reduce symptoms of: agitation, anxiety, depression and mood instability  2.  Increase knowledge and/or ability of: coping skills, healthy habits and self-management skills  3.  Demonstrate ability to: Increase healthy adjustment to current life circumstances  Progress towards Goals: Other  Interventions: Interventions utilized:  Supportive Counseling was utilized during today's follow-up session. The clinician processed with the patient how she has been doing since her last follow up session. The clinician discussed the integrated behavioral health teams recommendations and explored with the patient her treatment options. Clinician offered to follow up by telephone in two weeks to check  if the patient was able to access mental health services at [facility] or required additional resources.  Standardized  Assessments completed: Patient declined screening  Patient and/or Family Response: The patient stated, " I will go to [facility] walk in."   Patient Centered Plan: Patient is on the following Treatment Plan(s):The patient decided to go to [facility] walk in for mental health care. Assessment: Patient currently experiencing see above  Patient may benefit from see above  Plan: 1. Follow up with behavioral health clinician on :  2. Behavioral recommendations:  3. Referral(s): Community Mental Health Services (LME/Outside Clinic) Referred to [facility] 4. "From scale of 1-10, how likely are you to follow plan?":   Judith Part, Student-Social Work

## 2020-04-11 ENCOUNTER — Telehealth: Payer: Self-pay | Admitting: Pharmacist

## 2020-04-11 NOTE — Telephone Encounter (Signed)
Patient failed to provide requested 2021-2022 financial documentation. No additional medication assistance will be provided by [facility] without the required proof of income documentation. Patient notified by letter. Marquette Saa Administrative Assistant [facility]

## 2020-04-21 ENCOUNTER — Telehealth: Payer: Self-pay | Admitting: Pharmacy Technician

## 2020-04-21 NOTE — Telephone Encounter (Signed)
Patient called and left voice mail message on 04/18/20 regarding being upset about receiving correspondence from [facility] that she had been made inactive.  Patient stated in message that she did not request assistance from our program and did not remember signing-up for Medicaid.  Upset that she received a letter instead of a phone call from [facility].  Patient stated within her message that if [facility] could not fill no kind of medication that is not a narcotic, so the f*ck be it. That [facility] can't even prescribe good medications, and that she suffers from manic bipolar disorder.  Patient stated that someone better call her before she goes off on the staff at [facility] for not calling her.     [facility] filled Carbamazepine for patient on 02/20/20, because a prescription was sent to our clinic by [facility].  Patient received a phone call when medication was ready for pick-up.  Patient did not pick-up medication.    Patient did not provide proof of income information.  [facility] sent patient a letter on 04/11/20 making patient aware she was no longer eligible for medication assistance, because proof of income was not provided.  The letter also indicated that medication assistance would resume once all requested financial information was provided to our clinic.  Attempted to return patient's phone call on 04/18/20.  Unable to speak with patient.  Left a voice mail message for patient to return my call.  Patient has not return phone call.  Sherilyn Dacosta Care Manager [facility]

## 2020-05-07 ENCOUNTER — Telehealth: Payer: Self-pay | Admitting: Gerontology

## 2020-05-07 NOTE — Telephone Encounter (Signed)
Patient left a VM about wanting to speak to a doctor about a very personal matter, so made appt for her

## 2020-05-21 ENCOUNTER — Ambulatory Visit: Payer: Self-pay | Admitting: Gerontology

## 2020-05-29 ENCOUNTER — Ambulatory Visit: Payer: Self-pay | Admitting: Gerontology

## 2020-06-04 ENCOUNTER — Ambulatory Visit: Payer: Self-pay | Admitting: Adult Health

## 2020-06-05 ENCOUNTER — Other Ambulatory Visit: Payer: Self-pay | Admitting: Adult Health

## 2020-06-05 ENCOUNTER — Other Ambulatory Visit: Payer: Self-pay

## 2020-06-05 ENCOUNTER — Ambulatory Visit: Payer: Self-pay | Admitting: Adult Health

## 2020-06-05 ENCOUNTER — Other Ambulatory Visit: Payer: Self-pay | Admitting: Gerontology

## 2020-06-05 VITALS — BP 133/81 | HR 99 | Resp 18 | Ht 64.5 in | Wt 176.0 lb

## 2020-06-05 DIAGNOSIS — F3181 Bipolar II disorder: Secondary | ICD-10-CM

## 2020-06-05 DIAGNOSIS — G8929 Other chronic pain: Secondary | ICD-10-CM

## 2020-06-05 DIAGNOSIS — Z3009 Encounter for other general counseling and advice on contraception: Secondary | ICD-10-CM

## 2020-06-05 DIAGNOSIS — F419 Anxiety disorder, unspecified: Secondary | ICD-10-CM

## 2020-06-05 MED ORDER — MELOXICAM 15 MG PO TBDP: 15.0000 mg | ORAL_TABLET | Freq: Every day | ORAL | 0 refills | Status: DC

## 2020-06-05 MED ORDER — HYDROXYZINE HCL 25 MG PO TABS: 25.0000 mg | ORAL_TABLET | Freq: Three times a day (TID) | ORAL | 2 refills | Status: DC | PRN

## 2020-06-05 MED ORDER — DICLOFENAC SODIUM 1 % EX GEL: 4.0000 g | Freq: Four times a day (QID) | CUTANEOUS | 2 refills | Status: DC

## 2020-06-05 MED ORDER — CITALOPRAM HYDROBROMIDE 40 MG PO TABS: 40.0000 mg | ORAL_TABLET | Freq: Every day | ORAL | 2 refills | Status: DC

## 2020-06-05 MED ORDER — MEDROXYPROGESTERONE ACETATE 150 MG/ML IM SUSP
150.0000 mg | Freq: Once | INTRAMUSCULAR | Status: AC
  Administered 2020-06-16: 150 mg via INTRAMUSCULAR

## 2020-06-05 NOTE — Progress Notes (Signed)
  Patient: Susan Mcknight Female    DOB: 1986-03-09   35 y.o.   MRN: [id number] Visit Date: 06/05/2020  Today's Provider: Shawn Route, NP   No chief complaint on file.  Subjective:    HPI   Patient is a 35 y/o female who presents for routine f/u anxiety, bipolar disorder and chronic knee pain.  She reports doing well overall except for difficulty walking which she attributes to her knee pain.  Knee pain is chronic.  Prior work-up has been unremarkable.  Its been a while since she had imaging studies for her knee.  She is taking over-the-counter pain remedies without any significant improvement.  She reports better sleep quality, and less frequent panic attacks.  Her appetite is optimal.  She is following up with psychiatry as scheduled.  She is also requesting to be started on birth control.  She was on Depo-Provera before.  No Known Allergies Previous Medications   CARBAMAZEPINE (TEGRETOL) 200 MG TABLET    Take 1 tablet (200 mg total) by mouth 2 (two) times daily.    Review of Systems  Constitutional: Negative.   Respiratory: Negative.    Cardiovascular: Negative.   Musculoskeletal:  Positive for arthralgias (Right knee pain).  Skin: Negative.   Psychiatric/Behavioral:  Positive for sleep disturbance. Negative for self-injury and suicidal ideas.    Social History   Tobacco Use   Smoking status: Former Smoker    Types: Cigarettes    Quit date: 03/15/2016    Years since quitting: 4.2   Smokeless tobacco: Never Used  Substance Use Topics   Alcohol use: Yes    Comment: 2x/mo glass of wine   Objective:   There were no vitals taken for this visit.  Physical Exam Vitals and nursing note reviewed.  Constitutional:      Appearance: Normal appearance.  Cardiovascular:     Rate and Rhythm: Normal rate and regular rhythm.     Pulses: Normal pulses.     Heart sounds: Normal heart sounds.  Musculoskeletal:        General: Tenderness (Right knee tenderness with gentle range  of motion) present.  Skin:    General: Skin is warm and dry.  Neurological:     General: No focal deficit present.     Mental Status: She is alert and oriented to person, place, and time.  Psychiatric:        Mood and Affect: Mood normal.        Behavior: Behavior normal.        Assessment & Plan:     1. Bipolar II disorder (HCC) Well-controlled.  Continue current medications.  Referral to clinic counselor and psychiatrist initiated  2. Anxiety Well-controlled.  Continue current medications and follow-up with counselor and psychiatrist  3. Chronic pain of right knee Patient advised to take over-the-counter pain remedies as well as Flexeril.  If no improvement will consider starting her on a steroid taper as well as referral to Ortho.  4. Family planning OB referral initiated.  Depo-Provera refills provided.  She also needs a Pap smear - medroxyPROGESTERone (DEPO-PROVERA) injection 150 mg    Shawn Route, NP   [facility]

## 2020-06-11 ENCOUNTER — Ambulatory Visit: Payer: Self-pay | Admitting: Adult Health

## 2020-06-11 ENCOUNTER — Other Ambulatory Visit: Payer: Self-pay

## 2020-06-11 VITALS — BP 126/79 | HR 95 | Temp 98.6°F | Ht 63.0 in | Wt 179.3 lb

## 2020-06-11 NOTE — Progress Notes (Incomplete)
  Patient: Susan Mcknight Female    DOB: 21-Oct-1985   35 y.o.   MRN: [id number] Visit Date: 06/11/2020  Today's Provider: Shawn Route, NP   Chief Complaint  Patient presents with  . Follow-up    Check in on new medication start  Cream prescription was not received    Subjective:    HPI     No Known Allergies Previous Medications   CITALOPRAM (CELEXA) 40 MG TABLET    Take 1 tablet (40 mg total) by mouth daily.   DICLOFENAC SODIUM (VOLTAREN) 1 % GEL    Apply 4 g topically 4 (four) times daily.   HYDROXYZINE (ATARAX/VISTARIL) 25 MG TABLET    Take 1 tablet (25 mg total) by mouth every 8 (eight) hours as needed for anxiety.   MELOXICAM 15 MG TBDP    Take 15 mg by mouth daily.    Review of Systems  Social History   Tobacco Use  . Smoking status: Former Smoker    Types: Cigarettes    Quit date: 03/15/2016    Years since quitting: 4.2  . Smokeless tobacco: Never Used  Substance Use Topics  . Alcohol use: Yes    Comment: 2x/mo glass of wine   Objective:   BP 126/79 (BP Location: Left Arm, Patient Position: Sitting, Cuff Size: Large)   Pulse 95   Temp 98.6 F (37 C)   Ht 5\' 3"  (1.6 m)   Wt 179 lb 4.8 oz (81.3 kg)   SpO2 99%   BMI 31.76 kg/m   Physical Exam      Assessment & Plan:           , NP   [facility]

## 2020-06-12 ENCOUNTER — Telehealth: Payer: Self-pay | Admitting: Adult Health

## 2020-06-12 NOTE — Progress Notes (Incomplete)
  Patient: Niambi Smoak Female    DOB: 1985/06/13   35 y.o.   MRN: [id number] Visit Date: 06/12/2020  Today's Provider: Shawn Route, NP  Patient consents to Telephone/or Telehealth visit and two identifiers have been used to establish patient's identity prior to visit  No chief complaint on file.  Subjective:    HPI    No Known Allergies Previous Medications   CITALOPRAM (CELEXA) 40 MG TABLET    Take 1 tablet (40 mg total) by mouth daily.   DICLOFENAC SODIUM (VOLTAREN) 1 % GEL    Apply 4 g topically 4 (four) times daily.   HYDROXYZINE (ATARAX/VISTARIL) 25 MG TABLET    Take 1 tablet (25 mg total) by mouth every 8 (eight) hours as needed for anxiety.   MELOXICAM 15 MG TBDP    Take 15 mg by mouth daily.    Review of Systems  Social History   Tobacco Use  . Smoking status: Former Smoker    Types: Cigarettes    Quit date: 03/15/2016    Years since quitting: 4.2  . Smokeless tobacco: Never Used  Substance Use Topics  . Alcohol use: Yes    Comment: 2x/mo glass of wine   Objective:   There were no vitals taken for this visit.  Physical Exam      Assessment & Plan:           Shawn Route, NP   [facility]

## 2020-06-16 ENCOUNTER — Other Ambulatory Visit: Payer: Self-pay

## 2020-06-16 ENCOUNTER — Ambulatory Visit (LOCAL_COMMUNITY_HEALTH_CENTER): Payer: Self-pay | Admitting: Physician Assistant

## 2020-06-16 ENCOUNTER — Encounter: Payer: Self-pay | Admitting: Physician Assistant

## 2020-06-16 VITALS — BP 139/83 | Ht 61.6 in | Wt 180.6 lb

## 2020-06-16 DIAGNOSIS — Z3009 Encounter for other general counseling and advice on contraception: Secondary | ICD-10-CM

## 2020-06-16 DIAGNOSIS — Z30013 Encounter for initial prescription of injectable contraceptive: Secondary | ICD-10-CM

## 2020-06-16 DIAGNOSIS — Z113 Encounter for screening for infections with a predominantly sexual mode of transmission: Secondary | ICD-10-CM

## 2020-06-16 DIAGNOSIS — Z3042 Encounter for surveillance of injectable contraceptive: Secondary | ICD-10-CM

## 2020-06-16 DIAGNOSIS — Z01419 Encounter for gynecological examination (general) (routine) without abnormal findings: Secondary | ICD-10-CM

## 2020-06-16 NOTE — Progress Notes (Signed)
35 year old female seen today for a RP and Depo. Glenna Fellows, RN

## 2020-06-17 ENCOUNTER — Encounter: Payer: Self-pay | Admitting: Physician Assistant

## 2020-06-17 MED ORDER — MEDROXYPROGESTERONE ACETATE 150 MG/ML IM SUSP
150.0000 mg | INTRAMUSCULAR | Status: DC
  Administered 2020-09-04 – 2021-02-04 (×2): 150 mg via INTRAMUSCULAR
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
## 2020-06-18 LAB — IGP, APTIMA HPV
HPV Aptima: NEGATIVE
PAP Smear Comment: 0

## 2020-06-20 LAB — HM HIV SCREENING LAB: HM HIV Screening: NEGATIVE

## 2020-06-20 LAB — HM HEPATITIS C SCREENING LAB: HM Hepatitis Screen: NEGATIVE

## 2020-06-21 LAB — GONOCOCCUS CULTURE

## 2020-06-24 ENCOUNTER — Other Ambulatory Visit: Payer: Self-pay

## 2020-06-24 MED FILL — Hydroxyzine HCl Tab 25 MG: ORAL | 30 days supply | Qty: 30 | Fill #0 | Status: AC

## 2020-06-25 ENCOUNTER — Other Ambulatory Visit: Payer: Self-pay

## 2020-06-25 ENCOUNTER — Institutional Professional Consult (permissible substitution): Payer: Self-pay | Admitting: Licensed Clinical Social Worker

## 2020-07-02 ENCOUNTER — Ambulatory Visit: Payer: Self-pay | Admitting: Adult Health

## 2020-07-09 ENCOUNTER — Ambulatory Visit: Payer: Self-pay | Admitting: Adult Health

## 2020-07-16 ENCOUNTER — Other Ambulatory Visit: Payer: Self-pay

## 2020-07-16 ENCOUNTER — Encounter: Payer: Self-pay | Admitting: Adult Health

## 2020-07-16 ENCOUNTER — Ambulatory Visit: Payer: Self-pay | Admitting: Adult Health

## 2020-07-16 VITALS — BP 150/83 | HR 114 | Temp 98.1°F | Resp 18 | Ht 63.0 in | Wt 181.3 lb

## 2020-07-16 DIAGNOSIS — F419 Anxiety disorder, unspecified: Secondary | ICD-10-CM

## 2020-07-16 DIAGNOSIS — F3181 Bipolar II disorder: Secondary | ICD-10-CM

## 2020-07-16 DIAGNOSIS — H5213 Myopia, bilateral: Secondary | ICD-10-CM

## 2020-07-16 DIAGNOSIS — G8929 Other chronic pain: Secondary | ICD-10-CM

## 2020-07-16 DIAGNOSIS — R7303 Prediabetes: Secondary | ICD-10-CM

## 2020-07-16 DIAGNOSIS — K029 Dental caries, unspecified: Secondary | ICD-10-CM

## 2020-07-16 DIAGNOSIS — Z973 Presence of spectacles and contact lenses: Secondary | ICD-10-CM

## 2020-07-16 MED ORDER — DICLOFENAC SODIUM 3 % EX GEL
1.0000 "application " | Freq: Two times a day (BID) | CUTANEOUS | 2 refills | Status: DC
  Filled 2020-07-16: qty 100, fill #0

## 2020-07-16 MED ORDER — HYDROXYZINE HCL 25 MG PO TABS
ORAL_TABLET | Freq: Three times a day (TID) | ORAL | 2 refills | Status: DC | PRN
Start: 2020-07-16 — End: 2021-06-25
  Filled 2020-07-16 – 2020-07-28 (×2): qty 30, 30d supply, fill #0

## 2020-07-16 MED ORDER — MELOXICAM 15 MG PO TABS
ORAL_TABLET | Freq: Every day | ORAL | 0 refills | Status: DC
  Filled 2020-07-16 – 2020-07-28 (×2): qty 14, 14d supply, fill #0

## 2020-07-16 MED ORDER — CITALOPRAM HYDROBROMIDE 40 MG PO TABS
ORAL_TABLET | Freq: Every day | ORAL | 2 refills | Status: DC
  Filled 2020-07-16 – 2020-07-28 (×2): qty 30, 30d supply, fill #0

## 2020-07-16 NOTE — Progress Notes (Signed)
 " Patient: Susan Mcknight Female    DOB: Nov 26, 1985   35 y.o.   MRN: [id number] Visit Date: 07/16/2020  Today's Provider: Melita GORMAN Puls, NP   Chief Complaint  Patient presents with   Follow-up   Knee Pain    right   Dental Problem    needs orange card   Subjective:    HPI This is a 21-year-old female with a history of prediabetes, dental caries and chronic right knee pain who presents for follow-up.  She reports persistent right knee pain that is not improved with over-the-counter remedies.  Pain has been going on for a while and she is states that there has been interval worsening.  She also needs referral to the dentist for dental problems.  She states that she has been experiencing a lot of tooth ache and her gums are swollen and bleeding.  She is not checking her blood sugar and blood pressure as recommended.  She denies chest pain, palpitations, weakness and dizziness.  She reports anxiety.  She is being followed by the mental health specialist as well as a psychiatrist.  She reports taking all medications as prescribed.  No Known Allergies Previous Medications   CITALOPRAM  (CELEXA ) 40 MG TABLET    TAKE ONE TABLET BY MOUTH EVERY DAY   DICLOFENAC  SODIUM (VOLTAREN ) 1 % GEL    Apply 4 g topically 4 (four) times daily.   HYDROXYZINE  (ATARAX /VISTARIL ) 25 MG TABLET    TAKE ONE TABLET BY MOUTH EVERY 8 HOURS AS NEEDED FOR ANXIETY   MELOXICAM  (MOBIC ) 15 MG TABLET    TAKE ONE TABLET BY MOUTH EVERY DAY   MELOXICAM  15 MG TBDP    Take 15 mg by mouth daily.    Review of Systems  Constitutional: Negative.   HENT:  Positive for dental problem (Dental caries).   Respiratory: Negative.    Cardiovascular: Negative.   Gastrointestinal: Negative.   Endocrine: Negative.   Musculoskeletal:  Positive for arthralgias (Right knee pain).  Skin: Negative.   Psychiatric/Behavioral:  Positive for sleep disturbance (Reports insomnia).     Social History   Tobacco Use   Smoking status: Former  Smoker    Types: Cigarettes    Quit date: 03/15/2016    Years since quitting: 4.3   Smokeless tobacco: Never Used  Substance Use Topics   Alcohol use: Yes    Comment: 2x/mo glass of wine   Objective:   BP (!) 150/83 (BP Location: Right Arm, Patient Position: Sitting, Cuff Size: Normal)   Pulse (!) 114   Temp 98.1 F (36.7 C) (Oral)   Resp 18   Ht 5' 3 (1.6 m)   Wt 181 lb 4.8 oz (82.2 kg)   LMP  (LMP Unknown)   SpO2 98%   BMI 32.12 kg/m   Physical Exam Vitals and nursing note reviewed.  Constitutional:      Appearance: Normal appearance.  Cardiovascular:     Rate and Rhythm: Normal rate and regular rhythm.     Pulses: Normal pulses.     Heart sounds: Normal heart sounds.  Musculoskeletal:        General: Tenderness (right knee) present.  Skin:    General: Skin is warm and dry.  Neurological:     Mental Status: She is alert.         Assessment & Plan:      1. Chronic pain of right knee X-ray right knee -OTC Pain remedies Knee brace  2. Anxiety F/u with psyche Avoid triggers  3. Bipolar II disorder (HCC) Continue current medications F/u with psyche as scheduled  4. Dental caries (Primary) Will treat with empiric antibiotics and provide referral to dentist  5. Wears glasses Continue as recommended by ophthalmologist  6. Near sighted, bilateral F/u with ophthalmologist   7. Prediabetes Will obtain routine labs Low carb diet reviewed - Lipid Profile - Comp Met (CMET) - HgB A1c - TSH   Magddalene GORMAN Puls, NP   [facility] "

## 2020-07-17 LAB — COMPREHENSIVE METABOLIC PANEL
ALT: 20 IU/L (ref 0–32)
AST: 22 IU/L (ref 0–40)
Albumin/Globulin Ratio: 2 (ref 1.2–2.2)
Albumin: 5.1 g/dL — ABNORMAL HIGH (ref 3.8–4.8)
Alkaline Phosphatase: 70 IU/L (ref 44–121)
BUN/Creatinine Ratio: 10 (ref 9–23)
BUN: 9 mg/dL (ref 6–20)
Bilirubin Total: <0.2 mg/dL (ref 0.0–1.2)
CO2: 17 mmol/L — ABNORMAL LOW (ref 20–29)
Calcium: 10.1 mg/dL (ref 8.7–10.2)
Chloride: 107 mmol/L — ABNORMAL HIGH (ref 96–106)
Creatinine, Ser: 0.86 mg/dL (ref 0.57–1.00)
Globulin, Total: 2.6 g/dL (ref 1.5–4.5)
Glucose: 93 mg/dL (ref 65–99)
Potassium: 4.3 mmol/L (ref 3.5–5.2)
Sodium: 142 mmol/L (ref 134–144)
Total Protein: 7.7 g/dL (ref 6.0–8.5)
eGFR: 91 mL/min/{1.73_m2} (ref >59)

## 2020-07-17 LAB — LIPID PANEL
Chol/HDL Ratio: 5.2 ratio — ABNORMAL HIGH (ref 0.0–4.4)
Cholesterol, Total: 230 mg/dL — ABNORMAL HIGH (ref 100–199)
HDL: 44 mg/dL (ref >39)
LDL Chol Calc (NIH): 92 mg/dL (ref 0–99)
Triglycerides: 566 mg/dL (ref 0–149)
VLDL Cholesterol Cal: 94 mg/dL — ABNORMAL HIGH (ref 5–40)

## 2020-07-17 LAB — HEMOGLOBIN A1C
Est. average glucose Bld gHb Est-mCnc: 114 mg/dL
Hgb A1c MFr Bld: 5.6 % (ref 4.8–5.6)

## 2020-07-17 LAB — TSH: TSH: 0.897 u[IU]/mL (ref 0.450–4.500)

## 2020-07-28 ENCOUNTER — Other Ambulatory Visit: Payer: Self-pay

## 2020-08-13 ENCOUNTER — Ambulatory Visit: Payer: Self-pay | Admitting: Gerontology

## 2020-08-19 ENCOUNTER — Ambulatory Visit: Payer: Self-pay | Admitting: Gerontology

## 2020-08-25 ENCOUNTER — Ambulatory Visit: Payer: Self-pay | Admitting: Pharmacy Technician

## 2020-08-25 ENCOUNTER — Other Ambulatory Visit: Payer: Self-pay

## 2020-08-25 DIAGNOSIS — Z79899 Other long term (current) drug therapy: Secondary | ICD-10-CM

## 2020-08-25 NOTE — Progress Notes (Signed)
Completed [facility] application and contract.  Patient agreed to all terms of the [facility] contract.    Patient approved to receive medication assistance at [facility] until time for re-certification in 2023, and as long as eligibility criteria continues to be met.    Provided patient with community resource material based on her particular needs.    Susan Mcknight J. Ruhaan Nordahl Care Manager [facility]  

## 2020-08-26 ENCOUNTER — Other Ambulatory Visit: Payer: Self-pay

## 2020-08-26 ENCOUNTER — Ambulatory Visit: Payer: Self-pay | Admitting: Gerontology

## 2020-09-04 ENCOUNTER — Other Ambulatory Visit: Payer: Self-pay

## 2020-09-04 ENCOUNTER — Ambulatory Visit (LOCAL_COMMUNITY_HEALTH_CENTER): Payer: Self-pay

## 2020-09-04 VITALS — BP 137/87 | HR 98 | Ht 61.6 in | Wt 177.0 lb

## 2020-09-04 DIAGNOSIS — Z30013 Encounter for initial prescription of injectable contraceptive: Secondary | ICD-10-CM

## 2020-09-04 DIAGNOSIS — Z3009 Encounter for other general counseling and advice on contraception: Secondary | ICD-10-CM

## 2020-09-04 NOTE — Progress Notes (Signed)
Depo administered as per 06/16/2020 written order of Sadie Haber PA and client tolerated without complaint. Jossie Ng, RN

## 2020-09-05 ENCOUNTER — Other Ambulatory Visit: Payer: Self-pay

## 2020-09-05 ENCOUNTER — Encounter: Payer: Self-pay | Admitting: Intensive Care

## 2020-09-05 ENCOUNTER — Emergency Department
Admission: EM | Admit: 2020-09-05 | Discharge: 2020-09-05 | Disposition: A | Discharge status: Home / Self Care | Payer: Self-pay | Attending: Emergency Medicine | Admitting: Emergency Medicine

## 2020-09-05 DIAGNOSIS — M791 Myalgia, unspecified site: Secondary | ICD-10-CM | POA: Insufficient documentation

## 2020-09-05 DIAGNOSIS — S40812A Abrasion of left upper arm, initial encounter: Secondary | ICD-10-CM | POA: Insufficient documentation

## 2020-09-05 DIAGNOSIS — Z79899 Other long term (current) drug therapy: Secondary | ICD-10-CM | POA: Insufficient documentation

## 2020-09-05 DIAGNOSIS — S8001XA Contusion of right knee, initial encounter: Secondary | ICD-10-CM | POA: Insufficient documentation

## 2020-09-05 DIAGNOSIS — M7918 Myalgia, other site: Secondary | ICD-10-CM

## 2020-09-05 DIAGNOSIS — Y92410 Unspecified street and highway as the place of occurrence of the external cause: Secondary | ICD-10-CM | POA: Insufficient documentation

## 2020-09-05 DIAGNOSIS — Z87891 Personal history of nicotine dependence: Secondary | ICD-10-CM | POA: Insufficient documentation

## 2020-09-05 MED ORDER — CYCLOBENZAPRINE HCL 5 MG PO TABS: 5.0000 mg | ORAL_TABLET | Freq: Three times a day (TID) | ORAL | 0 refills | Status: DC | PRN

## 2020-09-05 MED ORDER — BACITRACIN-NEOMYCIN-POLYMYXIN 400-5-5000 EX OINT
TOPICAL_OINTMENT | Freq: Once | CUTANEOUS | Status: AC
  Filled 2020-09-05: qty 1

## 2020-09-05 MED ORDER — CYCLOBENZAPRINE HCL 10 MG PO TABS
10.0000 mg | ORAL_TABLET | Freq: Once | ORAL | Status: AC
  Administered 2020-09-05: 10 mg via ORAL
  Filled 2020-09-05: qty 1

## 2020-09-05 MED ORDER — IBUPROFEN 800 MG PO TABS
800.0000 mg | ORAL_TABLET | Freq: Once | ORAL | Status: AC
  Administered 2020-09-05: 800 mg via ORAL
  Filled 2020-09-05: qty 1
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
## 2020-10-08 ENCOUNTER — Encounter: Payer: Self-pay | Admitting: Adult Health

## 2020-10-22 NOTE — Progress Notes (Signed)
Left without being seen.

## 2020-10-28 ENCOUNTER — Other Ambulatory Visit: Payer: Self-pay | Admitting: Orthopedic Surgery

## 2020-10-28 DIAGNOSIS — M25561 Pain in right knee: Secondary | ICD-10-CM

## 2020-11-18 ENCOUNTER — Other Ambulatory Visit: Payer: Self-pay

## 2020-11-20 ENCOUNTER — Ambulatory Visit (LOCAL_COMMUNITY_HEALTH_CENTER): Payer: Self-pay | Admitting: Physician Assistant

## 2020-11-20 ENCOUNTER — Other Ambulatory Visit: Payer: Self-pay

## 2020-11-20 VITALS — BP 133/85 | Ht 63.0 in | Wt 185.2 lb

## 2020-11-20 DIAGNOSIS — Z3042 Encounter for surveillance of injectable contraceptive: Secondary | ICD-10-CM

## 2020-11-20 DIAGNOSIS — Z3009 Encounter for other general counseling and advice on contraception: Secondary | ICD-10-CM

## 2020-11-20 DIAGNOSIS — Z113 Encounter for screening for infections with a predominantly sexual mode of transmission: Secondary | ICD-10-CM

## 2020-11-20 LAB — WET PREP FOR TRICH, YEAST, CLUE
Trichomonas Exam: NEGATIVE
Yeast Exam: NEGATIVE

## 2020-11-20 NOTE — Progress Notes (Signed)
Pt here for Depo and STD check.  Wet mount results reviewed, no treatment required.  Depo 150 mg given IM in Lt deltoid without any complications.  Pt given reminder card to return in 11-13 weeks for Depo injections. Berdie Ogren, RN

## 2020-11-20 NOTE — Progress Notes (Signed)
   [facility]  Subjective:  Susan Mcknight is a 35 y.o. being seen today for Depo and STD screening.  Chief Complaint  Patient presents with   Contraception    Depo and STD check    HPI Patient states that she is not having any symptoms, but would like STD screening today.  Denies problems or concerns with Depo and is due for her injection today.   Does the patient have a current or past history of drug use? No   No components found for: HCV]   Health Maintenance Due  Topic Date Due   COVID-19 Vaccine (1) Never done   Pneumococcal Vaccine 45-45 Years old (1 - PCV) Never done   INFLUENZA VACCINE  Never done    Review of Systems  All other systems reviewed and are negative.  The following portions of the patient's history were reviewed and updated as appropriate: allergies, current medications, past family history, past medical history, past social history, past surgical history and problem list. Problem list updated.   See flowsheet for other program required questions.  Objective:   Vitals:   11/20/20 1048  BP: 133/85  Weight: 185 lb 3.2 oz (84 kg)  Height: 5\' 3"  (1.6 m)    Physical Exam Vitals and nursing note reviewed.  Constitutional:      General: She is not in acute distress.    Appearance: Normal appearance.  HENT:     Head: Normocephalic and atraumatic.     Mouth/Throat:     Mouth: Mucous membranes are moist.     Pharynx: Oropharynx is clear. No oropharyngeal exudate or posterior oropharyngeal erythema.  Eyes:     Conjunctiva/sclera: Conjunctivae normal.  Pulmonary:     Effort: Pulmonary effort is normal.  Musculoskeletal:     Cervical back: Neck supple. No tenderness.  Skin:    General: Skin is warm and dry.     Findings: No bruising, erythema, lesion or rash.  Neurological:     Mental Status: She is alert and oriented to person, place, and time.  Psychiatric:        Mood and  Affect: Mood normal.        Behavior: Behavior normal.        Thought Content: Thought content normal.        Judgment: Judgment normal.      Assessment and Plan:  Susan Mcknight is a 35 y.o. female presenting to the [facility] Department for a Women's Health problem visit  1. Encounter for counseling regarding contraception Reviewed with patient normal SE of Depo and when to call clinic with concerns. Enc condoms with all sex for STD protection.   2. Screening for STD (sexually transmitted disease) Await test results.  Counseled that RN will call if needs to RTC for treatment once results are back.  - WET PREP FOR TRICH, YEAST, CLUE - Chlamydia/Gonorrhea [place] [facility] - HIV [place] [facility] - Syphilis Serology, [place] [facility] - Chlamydia/Gonorrhea  Lab  3. Surveillance for Depo-Provera contraception Continue with Depo per 06/2020 order.     Return 11-13 weeks, for Depo.  No future appointments.  07/2020, PA

## 2021-02-04 ENCOUNTER — Other Ambulatory Visit: Payer: Self-pay

## 2021-02-04 ENCOUNTER — Ambulatory Visit (LOCAL_COMMUNITY_HEALTH_CENTER): Payer: Self-pay

## 2021-02-04 VITALS — BP 123/85 | Ht 63.0 in | Wt 179.0 lb

## 2021-02-04 DIAGNOSIS — Z3042 Encounter for surveillance of injectable contraceptive: Secondary | ICD-10-CM

## 2021-02-04 DIAGNOSIS — Z3009 Encounter for other general counseling and advice on contraception: Secondary | ICD-10-CM

## 2021-02-04 NOTE — Progress Notes (Signed)
10 weeks 6 days post depo. Reports she has no PCP and no health insurance, but trying to obtain insurance. Per her request, local provider resource list given and encouraged pt to establish PCP. Depo given today per order by Beatris Si, PA dated 06/16/2020. Tolerated well R delt. Next depo due 04/22/2021, pt aware. Jerel Shepherd, RN

## 2021-02-16 ENCOUNTER — Ambulatory Visit: Payer: Self-pay | Admitting: Nurse Practitioner

## 2021-02-16 ENCOUNTER — Other Ambulatory Visit: Payer: Self-pay

## 2021-02-16 ENCOUNTER — Encounter: Payer: Self-pay | Admitting: Nurse Practitioner

## 2021-02-16 DIAGNOSIS — A599 Trichomoniasis, unspecified: Secondary | ICD-10-CM

## 2021-02-16 DIAGNOSIS — Z113 Encounter for screening for infections with a predominantly sexual mode of transmission: Secondary | ICD-10-CM

## 2021-02-16 LAB — WET PREP FOR TRICH, YEAST, CLUE
Trichomonas Exam: POSITIVE — AB
Yeast Exam: NEGATIVE

## 2021-02-16 MED ORDER — METRONIDAZOLE 500 MG PO TABS: 500.0000 mg | ORAL_TABLET | Freq: Two times a day (BID) | ORAL | 0 refills | Status: AC

## 2021-02-16 NOTE — Progress Notes (Signed)
Pt here for STD screening.  Wet mount results reviewed and medication dispensed per Provider orders.  Pt declined condoms. Estevon Fluke M Shanvi Moyd, RN  
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
## 2021-02-17 ENCOUNTER — Encounter: Payer: Self-pay | Admitting: Nurse Practitioner

## 2021-02-21 LAB — GONOCOCCUS CULTURE

## 2021-02-22 IMAGING — DX DG CHEST 1V
1 series · 1 of 1 positions shown · non-contrast
Comparison: 02/27/2014

CLINICAL DATA: Cough, persistent cough.

EXAM:
CHEST  1 VIEW

[chest ap]
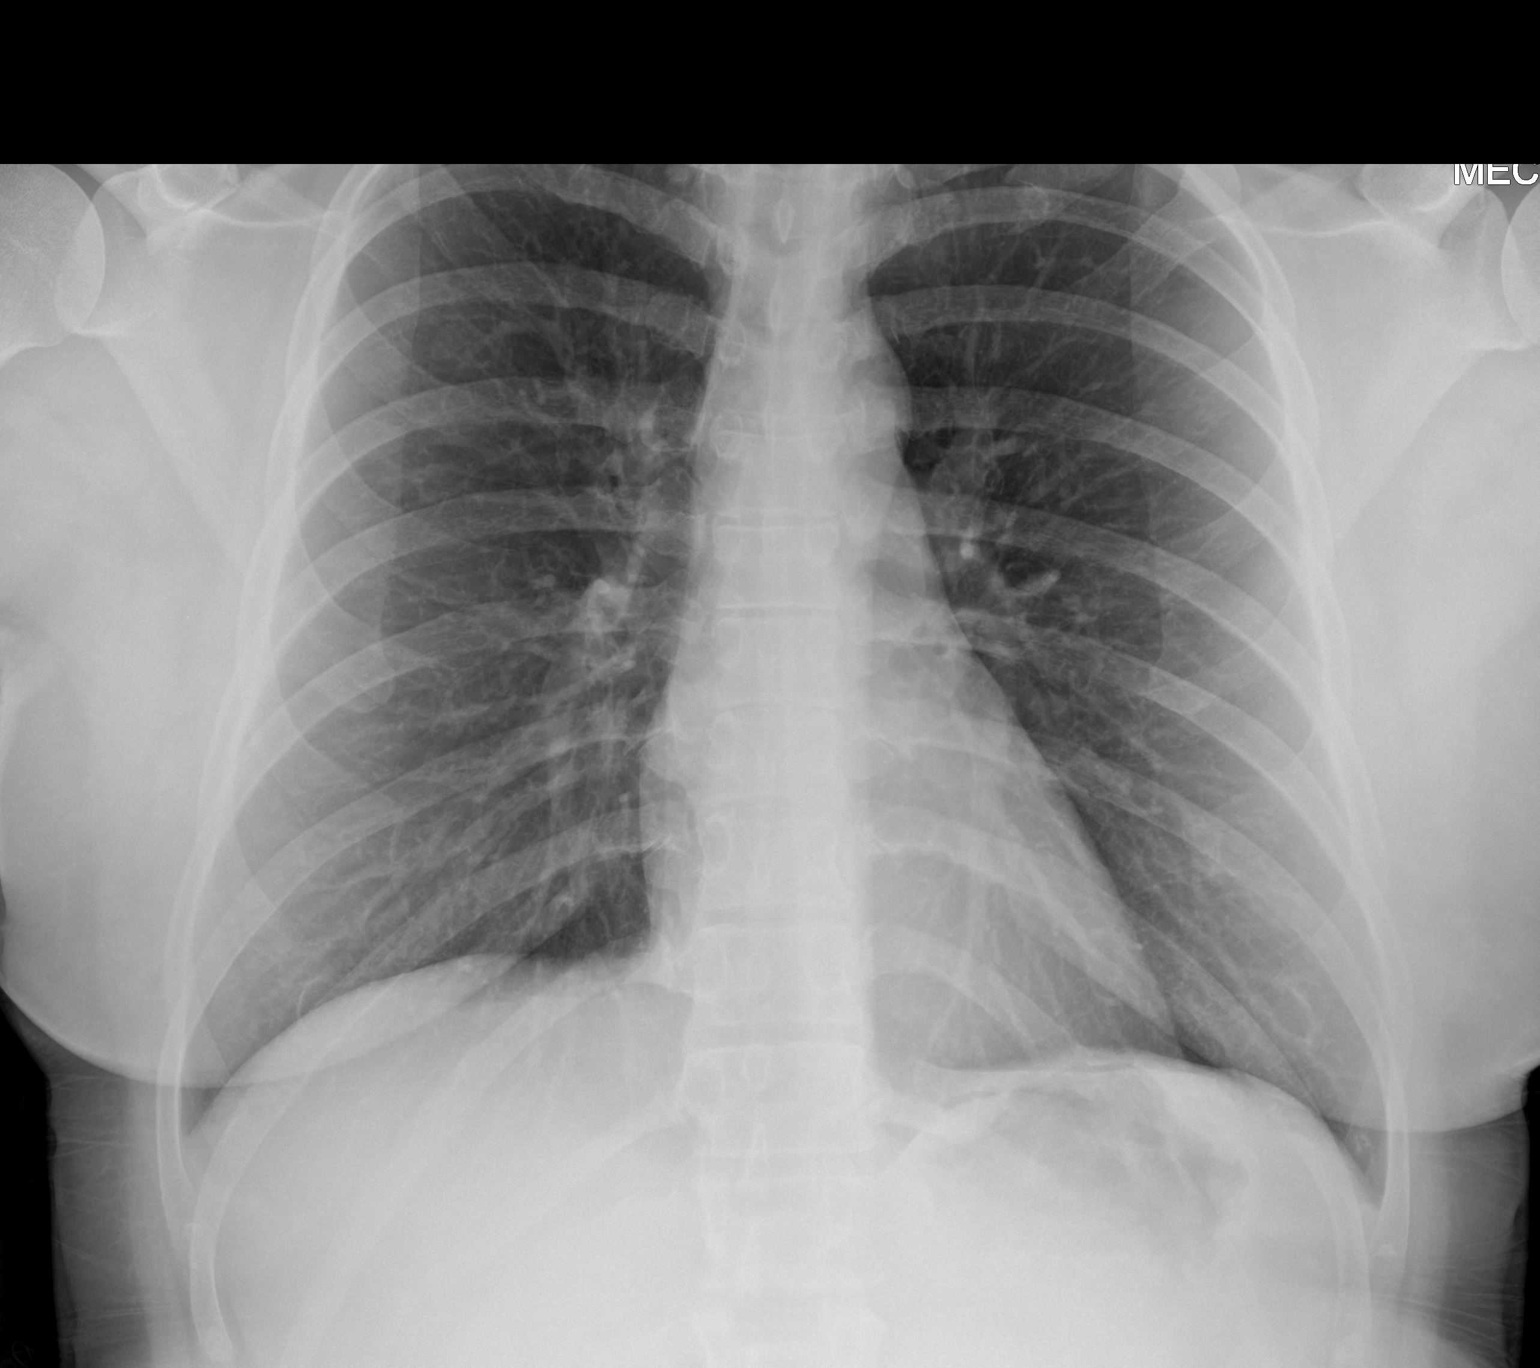

[1 of 1 positions shown; findings below may reference images not displayed]

FINDINGS: Cardiomediastinal contours and hilar structures are normal. Lungs
are clear. No sign of pleural effusion.

On limited assessment visualized skeletal structures are
unremarkable.
IMPRESSION: No acute cardiopulmonary disease.

## 2021-03-13 ENCOUNTER — Other Ambulatory Visit: Payer: Self-pay

## 2021-03-13 ENCOUNTER — Ambulatory Visit: Payer: Self-pay | Admitting: Family Medicine

## 2021-03-13 DIAGNOSIS — A599 Trichomoniasis, unspecified: Secondary | ICD-10-CM

## 2021-03-13 DIAGNOSIS — Z113 Encounter for screening for infections with a predominantly sexual mode of transmission: Secondary | ICD-10-CM

## 2021-03-13 LAB — WET PREP FOR TRICH, YEAST, CLUE
Trichomonas Exam: NEGATIVE
Yeast Exam: NEGATIVE

## 2021-03-13 MED ORDER — METRONIDAZOLE 500 MG PO TABS: 500.0000 mg | ORAL_TABLET | Freq: Two times a day (BID) | ORAL | 0 refills | Status: AC

## 2021-03-13 NOTE — Progress Notes (Signed)
S: Pt in clinic reporting having sex before partner was treated for trich. Pt requesting re-treatment and testing   O: wet prep negative   A/P: 1. Trichomonas infection  - metroNIDAZOLE (FLAGYL) 500 MG tablet; Take 1 tablet (500 mg total) by mouth 2 (two) times daily for 7 days.  Dispense: 14 tablet; Refill: 0  2. Screening examination for venereal disease Patient accepted all screenings including wet prep, vaginal CT/GC and declines bloodwork for HIV/RPR.  Patient meets criteria for HepB screening? Yes. Ordered? No - declined  Patient meets criteria for HepC screening? Yes. Ordered? No - declined   Wet prep results neg     Discussed time line for State Lab results and that patient will be called with positive results and encouraged patient to call if she had not heard in 2 weeks.  Counseled to return or seek care for continued or worsening symptoms Recommended condom use with all sex  Patient is currently using  Depo   to prevent pregnancy.  - Chlamydia/Gonorrhea [place] [facility] - WET PREP FOR TRICH, YEAST, CLUE   Wendi Snipes, FNP

## 2021-03-13 NOTE — Progress Notes (Signed)
Pt here for STD screening.  Wet mount results reviewed, no treatment required.  Pt declined condoms. Fujiko Picazo M Eusevio Schriver, RN  

## 2021-05-01 ENCOUNTER — Other Ambulatory Visit: Payer: Self-pay

## 2021-05-01 ENCOUNTER — Ambulatory Visit (LOCAL_COMMUNITY_HEALTH_CENTER): Payer: Self-pay

## 2021-05-01 ENCOUNTER — Encounter: Payer: Self-pay | Admitting: Family Medicine

## 2021-05-01 ENCOUNTER — Ambulatory Visit: Payer: Self-pay | Admitting: Family Medicine

## 2021-05-01 DIAGNOSIS — Z111 Encounter for screening for respiratory tuberculosis: Secondary | ICD-10-CM

## 2021-05-01 DIAGNOSIS — B379 Candidiasis, unspecified: Secondary | ICD-10-CM

## 2021-05-01 DIAGNOSIS — Z113 Encounter for screening for infections with a predominantly sexual mode of transmission: Secondary | ICD-10-CM

## 2021-05-01 LAB — WET PREP FOR TRICH, YEAST, CLUE: Trichomonas Exam: NEGATIVE

## 2021-05-01 MED ORDER — CLOTRIMAZOLE 1 % VA CREA: 1.0000 | TOPICAL_CREAM | Freq: Every day | VAGINAL | 0 refills | Status: DC

## 2021-05-01 NOTE — Progress Notes (Signed)
Patient for STD check. Wet prep reviewed, tx dispensed per providers orders. Medication charted. Condoms given.
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
## 2021-05-04 ENCOUNTER — Ambulatory Visit (LOCAL_COMMUNITY_HEALTH_CENTER): Payer: Self-pay

## 2021-05-04 ENCOUNTER — Other Ambulatory Visit: Payer: Self-pay

## 2021-05-04 DIAGNOSIS — Z111 Encounter for screening for respiratory tuberculosis: Secondary | ICD-10-CM

## 2021-05-04 LAB — TB SKIN TEST
Induration: 0 mm
TB Skin Test: NEGATIVE

## 2021-06-24 ENCOUNTER — Ambulatory Visit (LOCAL_COMMUNITY_HEALTH_CENTER): Payer: Self-pay | Admitting: Family Medicine

## 2021-06-24 ENCOUNTER — Encounter: Payer: Self-pay | Admitting: Family Medicine

## 2021-06-24 VITALS — BP 123/79 | Ht 61.6 in | Wt 182.6 lb

## 2021-06-24 DIAGNOSIS — Z3202 Encounter for pregnancy test, result negative: Secondary | ICD-10-CM

## 2021-06-24 DIAGNOSIS — Z113 Encounter for screening for infections with a predominantly sexual mode of transmission: Secondary | ICD-10-CM

## 2021-06-24 DIAGNOSIS — Z30013 Encounter for initial prescription of injectable contraceptive: Secondary | ICD-10-CM

## 2021-06-24 DIAGNOSIS — Z3009 Encounter for other general counseling and advice on contraception: Secondary | ICD-10-CM

## 2021-06-24 DIAGNOSIS — Z0000 Encounter for general adult medical examination without abnormal findings: Secondary | ICD-10-CM

## 2021-06-24 LAB — HM HEPATITIS C SCREENING LAB: HM Hepatitis Screen: NEGATIVE

## 2021-06-24 LAB — HM HIV SCREENING LAB: HM HIV Screening: NEGATIVE

## 2021-06-24 LAB — HEPATITIS B SURFACE ANTIGEN

## 2021-06-24 LAB — WET PREP FOR TRICH, YEAST, CLUE
Trichomonas Exam: NEGATIVE
Yeast Exam: NEGATIVE

## 2021-06-24 LAB — PREGNANCY, URINE: Preg Test, Ur: NEGATIVE

## 2021-06-24 MED ORDER — MEDROXYPROGESTERONE ACETATE 150 MG/ML IM SUSP
150.0000 mg | INTRAMUSCULAR | Status: AC
  Administered 2021-06-24 – 2022-01-11 (×2): 150 mg via INTRAMUSCULAR
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
## 2021-06-24 NOTE — Progress Notes (Signed)
Patient here for a PE, STD check and Depo shot. PCP list given. Appt reminder card given. Condoms given.  ?

## 2021-06-25 ENCOUNTER — Ambulatory Visit: Payer: Self-pay | Admitting: Gerontology

## 2021-06-25 ENCOUNTER — Other Ambulatory Visit: Payer: Self-pay

## 2021-06-25 ENCOUNTER — Encounter: Payer: Self-pay | Admitting: Gerontology

## 2021-06-25 VITALS — BP 124/84 | HR 102 | Temp 98.2°F | Ht 63.0 in | Wt 183.3 lb

## 2021-06-25 DIAGNOSIS — Z0000 Encounter for general adult medical examination without abnormal findings: Secondary | ICD-10-CM | POA: Insufficient documentation

## 2021-06-25 DIAGNOSIS — F419 Anxiety disorder, unspecified: Secondary | ICD-10-CM

## 2021-06-25 DIAGNOSIS — R000 Tachycardia, unspecified: Secondary | ICD-10-CM

## 2021-06-25 DIAGNOSIS — K029 Dental caries, unspecified: Secondary | ICD-10-CM

## 2021-06-25 MED ORDER — HYDROXYZINE HCL 25 MG PO TABS
ORAL_TABLET | Freq: Three times a day (TID) | ORAL | 0 refills | Status: DC | PRN
  Filled 2021-06-25: qty 30, 10d supply, fill #0

## 2021-06-25 NOTE — Progress Notes (Signed)
? ?Established Patient Office Visit ? ?Subjective:  ?Patient ID: Beata Beason, female    DOB: 07/09/1985  Age: 36 y.o. MRN: [id number] ? ?CC:  ?Chief Complaint  ?Patient presents with  ? Establish Care  ?  Haven't seen since 03/2020. Needs med refills. Dental.  ? ? ?HPI ?Danine Hor  is a 36 y/o female who has history of anxiety, depression, LGSIL on Pap smear of cervix presents for routine follow up visit and medication refill on her hydroxyzine. She missed couple of her follow up visits. She also reports having cavity to left lower mandibule and requests Dental referral. She states that her mood is good, denies suicidal nor homicidal ideation, reports being anxious sometimes , requests mental health referral.  Her heart rate was 109 b.p.m, denies chest pain, palpitation, shortness of breath and dizziness. Overall, she states that she's doing well and offers no further complaint. ? ?Past Medical History:  ?Diagnosis Date  ? Anxiety   ? Depression   ? LGSIL on Pap smear of cervix   ? ? ?Past Surgical History:  ?Procedure Laterality Date  ? COLPOSCOPY  2010  ? ? ?Family History  ?Problem Relation Age of Onset  ? Breast cancer Paternal Aunt   ? Diabetes Maternal Grandfather   ? Hypertension Maternal Grandfather   ? Stroke Maternal Grandfather   ? Ulcers Maternal Grandfather   ? Breast cancer Paternal Grandmother   ? Depression Mother   ? Lung cancer Paternal Grandfather   ? Bipolar disorder Half-Sister   ? Thyroid disease Half-Sister   ? Migraines Half-Brother   ? Healthy Father   ? ? ?Social History  ? ?Socioeconomic History  ? Marital status: Single  ?  Spouse name: Not on file  ? Number of children: 0  ? Years of education: Not on file  ? Highest education level: Some college, no degree  ?Occupational History  ? Not on file  ?Tobacco Use  ? Smoking status: Former  ?  Types: Cigarettes  ?  Quit date: 03/15/2016  ?  Years since quitting: 5.2  ? Smokeless tobacco: Never  ?Vaping Use  ? Vaping Use: Never used   ?Substance and Sexual Activity  ? Alcohol use: Not Currently  ?  Comment: 3x/mo glass of wine  ? Drug use: Not Currently  ?  Frequency: 15.0 times per week  ?  Types: Marijuana  ?  Comment: Reports at least  15 joints/week, up to 0.25 grams/week  ? Sexual activity: Yes  ?  Partners: Male  ?  Birth control/protection: Injection, Condom  ?Other Topics Concern  ? Not on file  ?Social History Narrative  ? ** Merged History Encounter **  ?    ? ?Social Determinants of Health  ? ?Financial Resource Strain: Not on file  ?Food Insecurity: No Food Insecurity  ? Worried About Charity fundraiser in the Last Year: Never true  ? Ran Out of Food in the Last Year: Never true  ?Transportation Needs: No Transportation Needs  ? Lack of Transportation (Medical): No  ? Lack of Transportation (Non-Medical): No  ?Physical Activity: Not on file  ?Stress: Not on file  ?Social Connections: Not on file  ?Intimate Partner Violence: Not At Risk  ? Fear of Current or Ex-Partner: No  ? Emotionally Abused: No  ? Physically Abused: No  ? Sexually Abused: No  ? ? ?Outpatient Medications Prior to Visit  ?Medication Sig Dispense Refill  ? citalopram (CELEXA) 40 MG tablet TAKE ONE TABLET BY  MOUTH EVERY DAY (Patient not taking: Reported on 11/20/2020) 30 tablet 2  ? clotrimazole (CLOTRIMAZOLE-7) 1 % vaginal cream Place 1 Applicatorful vaginally at bedtime. (Patient not taking: Reported on 06/24/2021) 45 g 0  ? cyclobenzaprine (FLEXERIL) 5 MG tablet Take 1 tablet (5 mg total) by mouth 3 (three) times daily as needed. (Patient not taking: Reported on 11/20/2020) 15 tablet 0  ? hydrOXYzine (ATARAX/VISTARIL) 25 MG tablet TAKE ONE TABLET BY MOUTH EVERY 8 HOURS AS NEEDED FOR ANXIETY (Patient not taking: Reported on 11/20/2020) 30 tablet 2  ? Multiple Vitamin (MULTIVITAMIN) tablet Take 1 tablet by mouth daily. (Patient not taking: Reported on 11/20/2020)    ? ?Facility-Administered Medications Prior to Visit  ?Medication Dose Route Frequency Provider Last Rate Last  Admin  ? medroxyPROGESTERone (DEPO-PROVERA) injection 150 mg  150 mg Intramuscular Q90 days Junious Dresser, FNP   150 mg at 06/24/21 1200  ? ? ?No Known Allergies ? ?ROS ?Review of Systems  ?Constitutional: Negative.   ?Respiratory: Negative.    ?Cardiovascular: Negative.   ?Skin: Negative.   ?Neurological: Negative.   ?Psychiatric/Behavioral:  The patient is nervous/anxious.   ? ?  ?Objective:  ?  ?Physical Exam ?HENT:  ?   Head: Normocephalic and atraumatic.  ?Cardiovascular:  ?   Rate and Rhythm: Regular rhythm. Tachycardia present.  ?   Pulses: Normal pulses.  ?   Heart sounds: Normal heart sounds.  ?Pulmonary:  ?   Effort: Pulmonary effort is normal.  ?   Breath sounds: Normal breath sounds.  ?Neurological:  ?   General: No focal deficit present.  ?   Mental Status: She is alert and oriented to person, place, and time. Mental status is at baseline.  ?Psychiatric:     ?   Mood and Affect: Mood normal.     ?   Behavior: Behavior normal.     ?   Thought Content: Thought content normal.     ?   Judgment: Judgment normal.  ? ? ?BP 124/84 (BP Location: Right Arm, Patient Position: Sitting, Cuff Size: Large)   Pulse (!) 102   Temp 98.2 ?F (36.8 ?C) (Oral)   Ht _0  (1.6 m)   Wt 183 lb 4.8 oz (83.1 kg)   LMP 06/05/2021 (Approximate)   SpO2 98%   BMI 32.47 kg/m?  ?Wt Readings from Last 3 Encounters:  ?06/25/21 183 lb 4.8 oz (83.1 kg)  ?06/24/21 182 lb 9.6 oz (82.8 kg)  ?02/04/21 179 lb (81.2 kg)  ?Encouraged weight loss ? ? ?Health Maintenance Due  ?Topic Date Due  ? COVID-19 Vaccine (1) Never done  ? ? ?There are no preventive care reminders to display for this patient. ? ?Lab Results  ?Component Value Date  ? TSH 0.897 07/16/2020  ? ?Lab Results  ?Component Value Date  ? WBC 11.4 (H) 01/02/2020  ? HGB 14.0 01/02/2020  ? HCT 40.8 01/02/2020  ? MCV 94 01/02/2020  ? PLT 331 01/02/2020  ? ?Lab Results  ?Component Value Date  ? NA 142 07/16/2020  ? K 4.3 07/16/2020  ? CO2 17 (L) 07/16/2020  ? GLUCOSE 93 07/16/2020   ? BUN 9 07/16/2020  ? CREATININE 0.86 07/16/2020  ? BILITOT <0.2 07/16/2020  ? ALKPHOS 70 07/16/2020  ? AST 22 07/16/2020  ? ALT 20 07/16/2020  ? PROT 7.7 07/16/2020  ? ALBUMIN 5.1 (H) 07/16/2020  ? CALCIUM 10.1 07/16/2020  ? ANIONGAP 10 02/19/2016  ? EGFR 91 07/16/2020  ? ?Lab Results  ?Component Value  Date  ? CHOL 230 (H) 07/16/2020  ? ?Lab Results  ?Component Value Date  ? HDL 44 07/16/2020  ? ?Lab Results  ?Component Value Date  ? [place] 92 07/16/2020  ? ?Lab Results  ?Component Value Date  ? TRIG 566 (HH) 07/16/2020  ? ?Lab Results  ?Component Value Date  ? CHOLHDL 5.2 (H) 07/16/2020  ? ?Lab Results  ?Component Value Date  ? HGBA1C 5.6 07/16/2020  ? ? ?  ?Assessment & Plan:  ? ? ? ? ?1. Anxiety ?- She will continue on current medication, will [facility] with [facility] behavioral team. She was advised to call the Crisis help line information for worsening symptoms. ?- hydrOXYzine (ATARAX) 25 MG tablet; TAKE ONE TABLET BY MOUTH EVERY 8 HOURS AS NEEDED FOR ANXIETY  Dispense: 30 tablet; Refill: 0 ? ?2. Dental cavity ?- Dental referral was ordered. ? ?3. Health care maintenance ?- Routine labs will be checked. ?- Comp Met (CMET); Future ?- CBC w/Diff; Future ?- HgB A1c; Future ?- TSH; Future ?- Lipid panel; Future ? ?4. Tachycardia ?-Her heart rate was 102 b.p.m, when rechecked, was encouraged to increase water intake, and to go to thw ED for worsening symptoms. ? ? ?Follow-up: Return in about 5 weeks (around 07/30/2021), or if symptoms worsen or fail to improve.  ? ? ?Abbygale Lapid Jerold Coombe, NP ?

## 2021-07-09 ENCOUNTER — Ambulatory Visit: Payer: Self-pay | Admitting: Licensed Clinical Social Worker

## 2021-07-17 ENCOUNTER — Other Ambulatory Visit: Payer: Self-pay

## 2021-07-20 ENCOUNTER — Other Ambulatory Visit: Payer: Self-pay

## 2021-07-22 ENCOUNTER — Other Ambulatory Visit: Payer: Self-pay

## 2021-07-27 ENCOUNTER — Other Ambulatory Visit: Payer: Self-pay

## 2021-07-30 ENCOUNTER — Ambulatory Visit: Payer: Self-pay | Admitting: Gerontology

## 2021-08-19 ENCOUNTER — Telehealth: Payer: Self-pay | Admitting: Family Medicine

## 2021-08-19 NOTE — Telephone Encounter (Signed)
Pt is due for Depo on 6/28 and she was notified to call and make that appointment.

## 2021-08-19 NOTE — Telephone Encounter (Signed)
Pt calls & states she has been experiencing bleeding since last month, thinks it is from her Depo; requesting to get her Depo early (last Depo 06/24/21). Please advise.

## 2021-10-23 ENCOUNTER — Other Ambulatory Visit: Payer: Self-pay | Admitting: Orthopedic Surgery

## 2021-10-23 DIAGNOSIS — M25561 Pain in right knee: Secondary | ICD-10-CM

## 2021-11-09 ENCOUNTER — Ambulatory Visit: Payer: Self-pay

## 2022-01-04 ENCOUNTER — Ambulatory Visit: Payer: Self-pay

## 2022-01-11 ENCOUNTER — Ambulatory Visit (LOCAL_COMMUNITY_HEALTH_CENTER): Payer: Self-pay | Admitting: Advanced Practice Midwife

## 2022-01-11 VITALS — BP 120/73 | Ht 61.5 in | Wt 154.8 lb

## 2022-01-11 DIAGNOSIS — N760 Acute vaginitis: Secondary | ICD-10-CM

## 2022-01-11 DIAGNOSIS — R87612 Low grade squamous intraepithelial lesion on cytologic smear of cervix (LGSIL): Secondary | ICD-10-CM

## 2022-01-11 DIAGNOSIS — Z3202 Encounter for pregnancy test, result negative: Secondary | ICD-10-CM

## 2022-01-11 DIAGNOSIS — Z3200 Encounter for pregnancy test, result unknown: Secondary | ICD-10-CM

## 2022-01-11 DIAGNOSIS — B9689 Other specified bacterial agents as the cause of diseases classified elsewhere: Secondary | ICD-10-CM

## 2022-01-11 DIAGNOSIS — Z3009 Encounter for other general counseling and advice on contraception: Secondary | ICD-10-CM

## 2022-01-11 DIAGNOSIS — Z30013 Encounter for initial prescription of injectable contraceptive: Secondary | ICD-10-CM

## 2022-01-11 LAB — PREGNANCY, URINE: Preg Test, Ur: NEGATIVE

## 2022-01-11 LAB — WET PREP FOR TRICH, YEAST, CLUE
Trichomonas Exam: NEGATIVE
Yeast Exam: NEGATIVE

## 2022-01-11 LAB — HM HEPATITIS C SCREENING LAB: HM Hepatitis Screen: NEGATIVE

## 2022-01-11 LAB — HM HIV SCREENING LAB: HM HIV Screening: NEGATIVE

## 2022-01-11 MED ORDER — LEVONORGESTREL 1.5 MG PO TABS: 1.5000 mg | ORAL_TABLET | Freq: Once | ORAL | 0 refills | Status: AC

## 2022-01-11 MED ORDER — METRONIDAZOLE 500 MG PO TABS: 500.0000 mg | ORAL_TABLET | Freq: Two times a day (BID) | ORAL | 0 refills | Status: AC

## 2022-01-11 NOTE — Progress Notes (Signed)
[facility] Main Number: [phone]  Contraception/Family Planning VISIT ENCOUNTER NOTE  Subjective:   Susan Mcknight is a 36 y.o. SBF exsmoker  G0P0000 female here for reproductive life counseling. The patient is currently using No Method - Other Reason to prevent pregnancy.  Last PE 06/24/21. Last pap 06/16/20 neg HPV neg. Last DMPA 06/24/21. LMP 12/28/21. Last sex 01/04/22 without condom; with current partner x 1 year; 1 partner in last 3 mo. Last cig age 14. Last MJ age 22. Last ETOH 2022 (2 beers). Here for DMPA reinitiation  The patient does not want a pregnancy in the next year.    Client states they are looking for the following:  High efficacy at preventing pregnancy  Denies abnormal vaginal bleeding, discharge, pelvic pain, problems with intercourse or other gynecologic concerns.    Gynecologic History Patient's last menstrual period was 12/28/2021 (approximate).  Health Maintenance Due  Topic Date Due   COVID-19 Vaccine (1) Never done   INFLUENZA VACCINE  Never done     The following portions of the patient's history were reviewed and updated as appropriate: allergies, current medications, past family history, past medical history, past social history, past surgical history and problem list.  Review of Systems Pertinent items are noted in HPI.   Objective:  BP 120/73   Ht 5' 1.5" (1.562 m)   Wt 154 lb 12.8 oz (70.2 kg)   LMP 12/28/2021 (Approximate)   BMI 28.78 kg/m  Gen: well appearing, NAD HEENT: no scleral icterus CV: RR Lung: Normal WOB Ext: warm well perfused  PELVIC: Normal appearing external genitalia; normal appearing vaginal mucosa and cervix.  No abnormal discharge noted.    Normal uterine size, no other palpable masses, no uterine or adnexal tenderness.   Assessment and Plan:   Need for emergency contraceptive care was assessed today.  Last unprotected sex was:  01/04/22  without condom; with current partner x 1 year; 1 partner in last 3 mo.   Patient reported > 120 hours .  Reviewed options and patient desired Plan B (levonorgestrel)   Contraception counseling: Reviewed methods in a patient centered fashion and used shared decision making with the patient. Utilized [facility] patient education tools as appropriate. The patient stated there goals and desires from a method are: High efficacy at preventing pregnancy  We reviewed the following methods in detail based on patient preferences available included: Hormonal Injection  Patient expressed they would like Hormonal Injection  This was provided to the patient today.  if not why not clearly documented  Risks, benefits, and typical effectiveness rates were reviewed.  Questions were answered.  Written information was also given to the patient to review.       will follow up in  11-13 weeks for surveillance.   was told to call with any further questions, or with any concerns about this method of contraception or cycle control.  Emphasized use of condoms 100% of the time for STI prevention.   1. Possible pregnancy PT neg today - Pregnancy, urine  2. Encounter for initial prescription of injectable contraceptive If PT neg today may have DMPA 150 mg IM per 06/24/21 order as pt desires Pt desires Plan B today as well Please counsel on need for abstinance/back up condoms next 7 days Pt counseled to do PT 01/18/22 and call if + - WET PREP FOR [place], YEAST, CLUE  3. Family planning Treat wet mount per standing orders Immunization nurse consult  -  Syphilis Serology, [facility] - HIV/HCV [facility] - Chlamydia/Gonorrhea [facility]  4. LGSIL on Pap smear of cervix 06/16/20 neg HPV neg    Please refer to After Visit Summary for other counseling recommendations.   No follow-ups on file.  Alberteen Spindle, CNM [facility] DEPARTMENT

## 2022-01-11 NOTE — Progress Notes (Signed)
Wet prep reviewed with provider, patient treated for BV. Plan B given per provider VO and Depo given. Next Depo card given to client. Patient counseled on importance of coming for Depo every 11-13 weeks. Jenetta Downer, RN

## 2022-01-11 NOTE — Progress Notes (Signed)
Patient here for Depo and STD testing. Last PE was 06/24/21, last Depo at that visit. Last Depo was 28 5/7 weeks ago. Last Pap test was 06/16/20, NILM and HPV negative. PT negative today.Jenetta Downer, RN

## 2022-03-31 ENCOUNTER — Ambulatory Visit: Payer: Self-pay | Admitting: Family Medicine

## 2022-03-31 VITALS — BP 146/91 | Ht 62.0 in | Wt 151.0 lb

## 2022-03-31 DIAGNOSIS — Z3009 Encounter for other general counseling and advice on contraception: Secondary | ICD-10-CM

## 2022-03-31 DIAGNOSIS — A630 Anogenital (venereal) warts: Secondary | ICD-10-CM

## 2022-03-31 DIAGNOSIS — Z113 Encounter for screening for infections with a predominantly sexual mode of transmission: Secondary | ICD-10-CM

## 2022-03-31 LAB — WET PREP FOR TRICH, YEAST, CLUE
Trichomonas Exam: NEGATIVE
Yeast Exam: NEGATIVE

## 2022-03-31 MED ORDER — MEDROXYPROGESTERONE ACETATE 150 MG/ML IM SUSP
150.0000 mg | Freq: Once | INTRAMUSCULAR | Status: AC
  Administered 2022-06-22: 150 mg via INTRAMUSCULAR

## 2022-03-31 NOTE — Progress Notes (Signed)
Pt is here for STD screening and a Depo injections.  Wet mount results reviewed, no treatment required.  Pt given reminder card to return in 11-13 weeks for next Depo.  Condoms given.  Windle Guard, RN
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
## 2022-04-05 LAB — GONOCOCCUS CULTURE

## 2022-05-06 ENCOUNTER — Ambulatory Visit: Payer: Self-pay | Admitting: Licensed Clinical Social Worker

## 2022-05-10 ENCOUNTER — Emergency Department (HOSPITAL_COMMUNITY)
Admission: EM | Admit: 2022-05-10 | Discharge: 2022-05-10 | Disposition: A | Discharge status: Home / Self Care | Payer: BC Managed Care – PPO | Attending: Emergency Medicine | Admitting: Emergency Medicine

## 2022-05-10 ENCOUNTER — Encounter (HOSPITAL_COMMUNITY): Payer: Self-pay | Admitting: Emergency Medicine

## 2022-05-10 ENCOUNTER — Other Ambulatory Visit: Payer: Self-pay

## 2022-05-10 DIAGNOSIS — S00502A Unspecified superficial injury of oral cavity, initial encounter: Secondary | ICD-10-CM | POA: Diagnosis present

## 2022-05-10 DIAGNOSIS — Y92410 Unspecified street and highway as the place of occurrence of the external cause: Secondary | ICD-10-CM | POA: Diagnosis not present

## 2022-05-10 DIAGNOSIS — S01551A Open bite of lip, initial encounter: Secondary | ICD-10-CM | POA: Diagnosis not present

## 2022-05-10 DIAGNOSIS — S0993XA Unspecified injury of face, initial encounter: Secondary | ICD-10-CM

## 2022-05-10 MED ORDER — PENICILLIN V POTASSIUM 500 MG PO TABS: 500.0000 mg | ORAL_TABLET | Freq: Four times a day (QID) | ORAL | 0 refills | Status: AC

## 2022-05-10 NOTE — Discharge Instructions (Signed)
Antibiotics as prescribed and complete the full course.  Recommend rinse with Listerine or salt water after every meal.  Soft/liquid food diet until cleared by dentist.  It is important that you see a dentist to try and prevent loss of your tooth.  Please call to schedule an appointment.
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
## 2022-06-22 ENCOUNTER — Ambulatory Visit (LOCAL_COMMUNITY_HEALTH_CENTER): Payer: Self-pay

## 2022-06-22 VITALS — BP 135/86 | Ht 63.0 in | Wt 146.5 lb

## 2022-06-22 DIAGNOSIS — Z3009 Encounter for other general counseling and advice on contraception: Secondary | ICD-10-CM

## 2022-06-22 DIAGNOSIS — Z3042 Encounter for surveillance of injectable contraceptive: Secondary | ICD-10-CM

## 2022-06-22 NOTE — Progress Notes (Signed)
11 weeks 6 days post depo.  Bp 135/86; consulted Aliene Altes FNP and ok to give depo.  Depo given IM RUOQ per order by Elveria Rising FNP dated 06/24/21 for one year; tolerated well.  Next depo due 09/07/22.  Pt informed needs annual PE and states she will get at next depo.  In hurry to get to another appt and says she will call for appt.   Cherlynn Polo, RN

## 2022-08-26 ENCOUNTER — Ambulatory Visit: Payer: Self-pay | Admitting: Advanced Practice Midwife

## 2022-08-26 ENCOUNTER — Encounter: Payer: Self-pay | Admitting: Advanced Practice Midwife

## 2022-08-26 DIAGNOSIS — Z113 Encounter for screening for infections with a predominantly sexual mode of transmission: Secondary | ICD-10-CM

## 2022-08-26 LAB — WET PREP FOR TRICH, YEAST, CLUE
Trichomonas Exam: NEGATIVE
Yeast Exam: NEGATIVE

## 2022-08-26 LAB — HM HIV SCREENING LAB: HM HIV Screening: NEGATIVE

## 2022-08-26 NOTE — Progress Notes (Signed)
Pt is here for STD screening.  Wet mount results reviewed, no treatment required per provider.  Condoms given.  
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
## 2022-08-30 LAB — GONOCOCCUS CULTURE

## 2022-12-02 ENCOUNTER — Encounter: Payer: Self-pay | Admitting: Advanced Practice Midwife

## 2022-12-02 ENCOUNTER — Ambulatory Visit (LOCAL_COMMUNITY_HEALTH_CENTER): Payer: Self-pay | Admitting: Advanced Practice Midwife

## 2022-12-02 VITALS — BP 129/82 | HR 95 | Ht 63.0 in | Wt 156.4 lb

## 2022-12-02 DIAGNOSIS — F17200 Nicotine dependence, unspecified, uncomplicated: Secondary | ICD-10-CM

## 2022-12-02 DIAGNOSIS — Z3009 Encounter for other general counseling and advice on contraception: Secondary | ICD-10-CM

## 2022-12-02 LAB — WET PREP FOR TRICH, YEAST, CLUE
Trichomonas Exam: NEGATIVE
Yeast Exam: NEGATIVE

## 2022-12-02 LAB — HM HIV SCREENING LAB: HM HIV Screening: NEGATIVE

## 2022-12-02 LAB — HM HEPATITIS C SCREENING LAB: HM Hepatitis Screen: NEGATIVE
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
## 2022-12-02 NOTE — Progress Notes (Signed)
Pt is here for PE and STD testing.  Wet mount results reviewed, no treatment required per SO.  FP packet given.  Berdie Ogren, RN.

## 2023-02-07 ENCOUNTER — Ambulatory Visit: Payer: Self-pay | Admitting: Family Medicine

## 2023-02-07 DIAGNOSIS — Z113 Encounter for screening for infections with a predominantly sexual mode of transmission: Secondary | ICD-10-CM

## 2023-02-07 LAB — WET PREP FOR TRICH, YEAST, CLUE
Trichomonas Exam: NEGATIVE
Yeast Exam: NEGATIVE

## 2023-02-07 LAB — HM HIV SCREENING LAB: HM HIV Screening: NEGATIVE
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
## 2023-02-07 NOTE — Progress Notes (Signed)
Pt is here for STD screening.  Wet mount results reviewed, no treatment required per SO.  Condoms declined.  Trista Ciocca M Mickeal Daws, RN  

## 2023-02-12 LAB — GONOCOCCUS CULTURE

## 2023-03-25 ENCOUNTER — Encounter: Payer: Self-pay | Admitting: Nurse Practitioner

## 2023-03-25 ENCOUNTER — Ambulatory Visit: Payer: Self-pay | Admitting: Nurse Practitioner

## 2023-03-25 VITALS — BP 134/81 | HR 84 | Ht 63.0 in | Wt 152.2 lb

## 2023-03-25 DIAGNOSIS — Z113 Encounter for screening for infections with a predominantly sexual mode of transmission: Secondary | ICD-10-CM

## 2023-03-25 DIAGNOSIS — Z30013 Encounter for initial prescription of injectable contraceptive: Secondary | ICD-10-CM

## 2023-03-25 DIAGNOSIS — N760 Acute vaginitis: Secondary | ICD-10-CM

## 2023-03-25 DIAGNOSIS — Z3009 Encounter for other general counseling and advice on contraception: Secondary | ICD-10-CM

## 2023-03-25 DIAGNOSIS — Z7251 High risk heterosexual behavior: Secondary | ICD-10-CM

## 2023-03-25 DIAGNOSIS — B9689 Other specified bacterial agents as the cause of diseases classified elsewhere: Secondary | ICD-10-CM

## 2023-03-25 LAB — HM HIV SCREENING LAB: HM HIV Screening: NEGATIVE

## 2023-03-25 LAB — WET PREP FOR TRICH, YEAST, CLUE
Trichomonas Exam: NEGATIVE
Yeast Exam: NEGATIVE

## 2023-03-25 LAB — HM HEPATITIS C SCREENING LAB: HM Hepatitis Screen: NEGATIVE

## 2023-03-25 LAB — HEPATITIS B SURFACE ANTIGEN

## 2023-03-25 MED ORDER — METRONIDAZOLE 0.75 % VA GEL: 1.0000 | Freq: Every day | VAGINAL | 0 refills | Status: AC

## 2023-03-25 MED ORDER — LEVONORGESTREL 1.5 MG PO TABS: 1.5000 mg | ORAL_TABLET | Freq: Once | ORAL | 0 refills | Status: AC

## 2023-03-25 MED ORDER — DOXYCYCLINE HYCLATE 100 MG PO TABS: 100.0000 mg | ORAL_TABLET | Freq: Two times a day (BID) | ORAL | Status: DC

## 2023-03-25 MED ORDER — MEDROXYPROGESTERONE ACETATE 150 MG/ML IM SUSP
150.0000 mg | INTRAMUSCULAR | Status: AC
  Administered 2023-03-25 – 2023-09-22 (×3): 150 mg via INTRAMUSCULAR

## 2023-03-25 NOTE — Progress Notes (Signed)
 Reviewed with patient in clinic. Appropriate management day of visit.

## 2023-03-25 NOTE — Progress Notes (Signed)
 Pt is here for Dep injection and STD testing.  Wet mount results reviewed. The patient was dispensed Metrogel  and Plan B  #1 today. I provided counseling today regarding the medication. We discussed the medication, the side effects and when to call clinic. Patient given the opportunity to ask questions. Questions answered.  Depo 150 mg give IM in RUOQ.  Pt tolerated well.  Reminder card given for pt to return in 11-13 weeks for next Depo injection.  Maudie CHRISTELLA Edison, RN
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
## 2023-03-28 ENCOUNTER — Telehealth: Payer: Self-pay | Admitting: Licensed Clinical Social Worker

## 2023-03-28 NOTE — Telephone Encounter (Signed)
 Patient returned call- patient and LCSW discussed patient's need for medication management. LCSW shared information on [facility].

## 2023-03-28 NOTE — Telephone Encounter (Signed)
 03/25/23 left vm requesting an appointment.   LCSW attempted to return call and lvm.

## 2023-04-01 LAB — GONOCOCCUS CULTURE

## 2023-04-12 NOTE — Addendum Note (Signed)
Addended by: Berdie Ogren on: 04/12/2023 11:24 AM   Modules accepted: Orders

## 2023-06-23 ENCOUNTER — Ambulatory Visit: Payer: Self-pay

## 2023-06-23 ENCOUNTER — Encounter: Payer: Self-pay | Admitting: Nurse Practitioner

## 2023-06-23 ENCOUNTER — Ambulatory Visit: Payer: Self-pay | Admitting: Nurse Practitioner

## 2023-06-23 VITALS — BP 127/78 | HR 90 | Ht 62.0 in | Wt 167.2 lb

## 2023-06-23 DIAGNOSIS — Z30013 Encounter for initial prescription of injectable contraceptive: Secondary | ICD-10-CM

## 2023-06-23 DIAGNOSIS — Z3009 Encounter for other general counseling and advice on contraception: Secondary | ICD-10-CM

## 2023-06-23 DIAGNOSIS — Z113 Encounter for screening for infections with a predominantly sexual mode of transmission: Secondary | ICD-10-CM

## 2023-06-23 LAB — WET PREP FOR TRICH, YEAST, CLUE
Trichomonas Exam: NEGATIVE
Yeast Exam: NEGATIVE

## 2023-06-23 NOTE — Progress Notes (Signed)
 Pt is here for an acute visit and depo injection.Wet prep results reviewed with pt, no treatment required per standing order. Condoms declined. Depo given at the LUOQ and Pt tolerated well to injection.Reminder card given. Sonda Primes, RN.
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
## 2023-06-29 LAB — GONOCOCCUS CULTURE

## 2023-09-22 ENCOUNTER — Ambulatory Visit (LOCAL_COMMUNITY_HEALTH_CENTER): Payer: Self-pay | Admitting: Physician Assistant

## 2023-09-22 ENCOUNTER — Telehealth: Payer: Self-pay

## 2023-09-22 ENCOUNTER — Encounter: Payer: Self-pay | Admitting: Physician Assistant

## 2023-09-22 VITALS — BP 136/86 | HR 104 | Ht 62.0 in | Wt 163.6 lb

## 2023-09-22 DIAGNOSIS — Z3009 Encounter for other general counseling and advice on contraception: Secondary | ICD-10-CM

## 2023-09-22 DIAGNOSIS — Z30013 Encounter for initial prescription of injectable contraceptive: Secondary | ICD-10-CM

## 2023-09-22 DIAGNOSIS — Z113 Encounter for screening for infections with a predominantly sexual mode of transmission: Secondary | ICD-10-CM

## 2023-09-22 LAB — WET PREP FOR TRICH, YEAST, CLUE
Clue Cell Exam: NEGATIVE
Trichomonas Exam: NEGATIVE
Yeast Exam: NEGATIVE

## 2023-09-22 LAB — HM HIV SCREENING LAB: HM HIV Screening: NEGATIVE
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
## 2023-09-22 NOTE — Progress Notes (Addendum)
 Pt is here for Depo and STI check. Wet prep reviewed  with patient and requires no treatment per provider.Depo IM injection given at the [facility] and patient tolerated well to injection. Condoms declined and reminder card given. Wilkie Drought, RN.

## 2023-09-30 ENCOUNTER — Encounter: Payer: Self-pay | Admitting: Physician Assistant

## 2023-10-28 ENCOUNTER — Ambulatory Visit (LOCAL_COMMUNITY_HEALTH_CENTER): Payer: Self-pay

## 2023-10-28 DIAGNOSIS — Z111 Encounter for screening for respiratory tuberculosis: Secondary | ICD-10-CM

## 2023-10-31 ENCOUNTER — Ambulatory Visit (LOCAL_COMMUNITY_HEALTH_CENTER): Payer: Self-pay

## 2023-10-31 ENCOUNTER — Other Ambulatory Visit: Payer: Self-pay

## 2023-10-31 LAB — TB SKIN TEST
Induration: 0 mm
TB Skin Test: NEGATIVE

## 2023-12-20 ENCOUNTER — Ambulatory Visit: Payer: Self-pay

## 2023-12-20 VITALS — BP 139/89 | HR 104

## 2023-12-20 DIAGNOSIS — Z113 Encounter for screening for infections with a predominantly sexual mode of transmission: Secondary | ICD-10-CM

## 2023-12-20 LAB — WET PREP FOR TRICH, YEAST, CLUE
Clue Cell Exam: NEGATIVE
Trichomonas Exam: NEGATIVE
Yeast Exam: NEGATIVE

## 2023-12-20 LAB — HM HIV SCREENING LAB: HM HIV Screening: NEGATIVE
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
## 2023-12-20 NOTE — Progress Notes (Signed)
 Pt is here STD screening. Wet prep results reviewed with Pt and required no treatment. Condoms declined. Wilkie Drought, RN.

## 2023-12-25 LAB — GONOCOCCUS CULTURE

## 2024-03-12 ENCOUNTER — Emergency Department
Admission: EM | Admit: 2024-03-12 | Discharge: 2024-03-12 | Disposition: A | Discharge status: Home / Self Care | Payer: Self-pay | Attending: Emergency Medicine | Admitting: Emergency Medicine

## 2024-03-12 ENCOUNTER — Other Ambulatory Visit: Payer: Self-pay

## 2024-03-12 DIAGNOSIS — J069 Acute upper respiratory infection, unspecified: Secondary | ICD-10-CM | POA: Insufficient documentation

## 2024-03-12 DIAGNOSIS — F17200 Nicotine dependence, unspecified, uncomplicated: Secondary | ICD-10-CM | POA: Insufficient documentation

## 2024-03-12 MED ORDER — GUAIFENESIN-CODEINE 100-10 MG/5ML PO SOLN: 5.0000 mL | Freq: Three times a day (TID) | ORAL | 0 refills | Status: AC | PRN

## 2024-03-12 MED ORDER — DOXYCYCLINE HYCLATE 100 MG PO TABS: 100.0000 mg | ORAL_TABLET | Freq: Two times a day (BID) | ORAL | 0 refills | Status: AC

## 2024-03-12 NOTE — ED Triage Notes (Signed)
 C/O productive cough and sinus congestion x 2.5 weeks.  AAOx3. Skin warm and dry. NAD

## 2024-03-12 NOTE — ED Provider Notes (Signed)
 "  [facility] Provider Note    Event Date/Time   First MD Initiated Contact with Patient 03/12/24 1006     (approximate)   History   Cough   HPI  Susan Mcknight is a 38 y.o. female with no significant past medical history presents emergency department with cough for 2-1/2 weeks.  States might have had a fever few days ago but is not sure.  Getting yellow to clear mucus out.  Patient is a smoker.  No chest pain, shortness of breath.  No wheezing.  No vomiting or diarrhea      Physical Exam   Triage Vital Signs: ED Triage Vitals [03/12/24 0954]  Encounter Vitals Group     BP (!) 142/90     Girls Systolic BP Percentile      Girls Diastolic BP Percentile      Boys Systolic BP Percentile      Boys Diastolic BP Percentile      Pulse Rate 99     Resp 16     Temp 98.4 F (36.9 C)     Temp Source Oral     SpO2 98 %     Weight 163 lb 9.3 oz (74.2 kg)     Height      Head Circumference      Peak Flow      Pain Score 0     Pain Loc      Pain Education      Exclude from Growth Chart     Most recent vital signs: Vitals:   03/12/24 0954  BP: (!) 142/90  Pulse: 99  Resp: 16  Temp: 98.4 F (36.9 C)  SpO2: 98%     General: Awake, no distress.   CV:  Good peripheral perfusion. regular rate and  rhythm Resp:  Normal effort. Lungs CTA Abd:  No distention.   Other:      ED Results / Procedures / Treatments   Labs (all labs ordered are listed, but only abnormal results are displayed) Labs Reviewed - No data to display   EKG     RADIOLOGY     PROCEDURES:   Procedures  Critical Care:   Chief Complaint  Patient presents with   Cough      MEDICATIONS ORDERED IN ED: Medications - No data to display   IMPRESSION / MDM / ASSESSMENT AND PLAN / ED COURSE  I reviewed the triage vital signs and the nursing notes.                              Differential diagnosis includes, but is not limited to, COVID and influenza,  RSV, upper respiratory, CAP, acute bronchitis  Patient's presentation is most consistent with acute illness / injury with system symptoms.   At this time unnecessary test for COVID, influenza, RSV as patient's been sick for 2-1/2 weeks.  Lungs are clear, she is afebrile and vitals are normal so do not feel she has CAP.  Will go ahead and treat her with doxycycline , Robitussin AC.  She is to follow-up with her regular doctor if not improving in 3 to 4 days.  Return if worsening.  She is in agreement treatment plan.  Discharged stable condition.      FINAL CLINICAL IMPRESSION(S) / ED DIAGNOSES   Final diagnoses:  Upper respiratory tract infection, unspecified type     Rx / DC Orders   ED Discharge  Orders          Ordered    doxycycline  (VIBRA -TABS) 100 MG tablet  2 times daily        03/12/24 1015    guaiFENesin -codeine  100-10 MG/5ML syrup  3 times daily PRN        03/12/24 1015             Note:  This document was prepared using Dragon voice recognition software and may include unintentional dictation errors.    Gasper Devere ORN, PA-C 03/12/24 1022    Arlander Charleston, MD 03/12/24 1058  "

## 2024-04-16 ENCOUNTER — Ambulatory Visit: Payer: Self-pay

## 2024-04-23 ENCOUNTER — Ambulatory Visit: Payer: Self-pay
